# Patient Record
Sex: Female | Born: 1985 | Race: White | Hispanic: No | Marital: Married | Smoking: Former smoker
Health system: Southern US, Community
[De-identification: ages and names within clinical notes are randomized; demographics above are authoritative.]

## PROBLEM LIST (undated history)

## (undated) DIAGNOSIS — N390 Urinary tract infection, site not specified: Secondary | ICD-10-CM

## (undated) DIAGNOSIS — Z9289 Personal history of other medical treatment: Secondary | ICD-10-CM

## (undated) DIAGNOSIS — Z803 Family history of malignant neoplasm of breast: Secondary | ICD-10-CM

## (undated) DIAGNOSIS — A630 Anogenital (venereal) warts: Secondary | ICD-10-CM

## (undated) HISTORY — PX: WISDOM TOOTH EXTRACTION: SHX21

## (undated) HISTORY — DX: Anogenital (venereal) warts: A63.0

## (undated) HISTORY — DX: Urinary tract infection, site not specified: N39.0

## (undated) HISTORY — DX: Personal history of other medical treatment: Z92.89

## (undated) HISTORY — DX: Family history of malignant neoplasm of breast: Z80.3

---
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
## 2005-02-11 ENCOUNTER — Ambulatory Visit: Payer: Self-pay | Admitting: Family Medicine

## 2005-02-25 ENCOUNTER — Ambulatory Visit: Payer: Self-pay | Admitting: Family Medicine

## 2011-04-01 ENCOUNTER — Ambulatory Visit: Payer: Self-pay | Admitting: Internal Medicine

## 2011-04-24 ENCOUNTER — Ambulatory Visit: Payer: Self-pay | Admitting: Family Medicine

## 2011-12-23 ENCOUNTER — Ambulatory Visit: Payer: Self-pay

## 2011-12-23 LAB — RAPID STREP-A WITH REFLX: Micro Text Report: NEGATIVE

## 2011-12-25 LAB — BETA STREP CULTURE(ARMC)

## 2012-04-26 ENCOUNTER — Inpatient Hospital Stay: Payer: Self-pay

## 2012-04-26 LAB — CBC WITH DIFFERENTIAL/PLATELET
Basophil #: 0.1 10*3/uL (ref 0.0–0.1)
HCT: 36.8 % (ref 35.0–47.0)
HGB: 12.2 g/dL (ref 12.0–16.0)
Lymphocyte #: 3.3 10*3/uL (ref 1.0–3.6)
Lymphocyte %: 20.6 %
MCH: 29.7 pg (ref 26.0–34.0)
MCHC: 33.2 g/dL (ref 32.0–36.0)
Monocyte %: 5.7 %
Neutrophil #: 11.6 10*3/uL — ABNORMAL HIGH (ref 1.4–6.5)
Neutrophil %: 72.3 %
Platelet: 266 10*3/uL (ref 150–440)
RDW: 13.7 % (ref 11.5–14.5)
WBC: 16 10*3/uL — ABNORMAL HIGH (ref 3.6–11.0)

## 2013-10-07 ENCOUNTER — Ambulatory Visit: Payer: Self-pay | Admitting: Physician Assistant

## 2013-10-07 LAB — RAPID STREP-A WITH REFLX: Micro Text Report: NEGATIVE

## 2014-02-22 ENCOUNTER — Ambulatory Visit: Payer: Self-pay | Admitting: Family Medicine

## 2014-08-01 DIAGNOSIS — Z9289 Personal history of other medical treatment: Secondary | ICD-10-CM

## 2014-08-01 HISTORY — DX: Personal history of other medical treatment: Z92.89

## 2014-09-28 ENCOUNTER — Ambulatory Visit: Payer: Self-pay | Admitting: Physician Assistant

## 2015-03-07 NOTE — H&P (Signed)
L&D Evaluation:  History:   HPI 29 year old G3 p0020 with EDC=05/08/2012 based on an LMP=08/02/2011 and confirmed with an 8wk 5 day ultrasound presents at 7238 2/7 weeks with c/o LOF since 0415 this AM. Has had no bleeding and a few contractions. Baby active. [facility] remarkable for early onset, normal anatomy scan, elevated one hr GTT with a normal 3 hr GTT, smoking, and a positive GBS. TWG=20#. LABS: A POS, VI, RI, POS GBS. Had last TDAP current on June 2012    Presents with leaking fluid    Patient's Medical History No Chronic Illness    Patient's Surgical History wisdom teeth extraction    Medications Pre Natal Vitamins    Allergies PCN, causes rash    Social History tobacco    Family History Non-Contributory   ROS:   ROS se HPI   Exam:   General no apparent distress    Mental Status clear    Chest clear    Heart normal sinus rhythm, no murmur/gallop/rubs    Abdomen gravid, non-tender    Estimated Fetal Weight Average for gestational age, 7#to7 1/2#    Fetal Position cephalic    Edema no edema    Reflexes 1+    Pelvic no external lesions, SSE+positive pooling and fern. CX:1/50%/-1 to -2    Mebranes Ruptured    Description clear    Ucx occ    Skin dry   Impression:   Impression IUP at 38 2/7 weeks with PROM. +GBS   Plan:   Plan EFM/NST, monitor contractions and for cervical change, Start Kefzol for GBS prophyllaxis. Pitocin induction./augmentation after  discussion. of purpose and risks. Stadol for pain. Epidural when appropriate.   Electronic Signatures: Trinna BalloonGutierrez, Nupur Hohman L (CNM)  (Signed 30-Jun-13 19:20)  Authored: L&D Evaluation   Last Updated: 30-Jun-13 19:20 by Trinna BalloonGutierrez, Latravion Graves L (CNM)

## 2015-04-16 ENCOUNTER — Emergency Department: Payer: BLUE CROSS/BLUE SHIELD

## 2015-04-16 ENCOUNTER — Emergency Department
Admission: EM | Admit: 2015-04-16 | Discharge: 2015-04-16 | Disposition: A | Discharge status: Home / Self Care | Payer: BLUE CROSS/BLUE SHIELD | Attending: Emergency Medicine | Admitting: Emergency Medicine

## 2015-04-16 ENCOUNTER — Encounter: Payer: Self-pay | Admitting: Emergency Medicine

## 2015-04-16 DIAGNOSIS — Y998 Other external cause status: Secondary | ICD-10-CM | POA: Diagnosis not present

## 2015-04-16 DIAGNOSIS — Z720 Tobacco use: Secondary | ICD-10-CM | POA: Insufficient documentation

## 2015-04-16 DIAGNOSIS — Y9389 Activity, other specified: Secondary | ICD-10-CM | POA: Insufficient documentation

## 2015-04-16 DIAGNOSIS — S60221A Contusion of right hand, initial encounter: Secondary | ICD-10-CM | POA: Diagnosis not present

## 2015-04-16 DIAGNOSIS — Y92410 Unspecified street and highway as the place of occurrence of the external cause: Secondary | ICD-10-CM | POA: Insufficient documentation

## 2015-04-16 DIAGNOSIS — S0003XA Contusion of scalp, initial encounter: Secondary | ICD-10-CM | POA: Insufficient documentation

## 2015-04-16 DIAGNOSIS — S0992XA Unspecified injury of nose, initial encounter: Secondary | ICD-10-CM | POA: Diagnosis present

## 2015-04-16 DIAGNOSIS — S0033XA Contusion of nose, initial encounter: Secondary | ICD-10-CM | POA: Diagnosis not present

## 2015-04-16 MED ORDER — IBUPROFEN 800 MG PO TABS
ORAL_TABLET | ORAL | Status: AC
  Administered 2015-04-16: 800 mg via ORAL
  Filled 2015-04-16: qty 1

## 2015-04-16 MED ORDER — OXYCODONE-ACETAMINOPHEN 5-325 MG PO TABS
2.0000 | ORAL_TABLET | Freq: Once | ORAL | Status: AC
  Administered 2015-04-16: 2 via ORAL

## 2015-04-16 MED ORDER — OXYCODONE-ACETAMINOPHEN 5-325 MG PO TABS
ORAL_TABLET | ORAL | Status: AC
  Administered 2015-04-16: 2 via ORAL
  Filled 2015-04-16: qty 2

## 2015-04-16 MED ORDER — IBUPROFEN 800 MG PO TABS
800.0000 mg | ORAL_TABLET | Freq: Once | ORAL | Status: AC
  Administered 2015-04-16: 800 mg via ORAL
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
## 2017-01-22 ENCOUNTER — Telehealth: Payer: Self-pay | Admitting: Obstetrics and Gynecology

## 2017-01-22 ENCOUNTER — Other Ambulatory Visit: Payer: Self-pay | Admitting: Obstetrics and Gynecology

## 2017-01-22 DIAGNOSIS — Z3041 Encounter for surveillance of contraceptive pills: Secondary | ICD-10-CM

## 2017-01-22 MED ORDER — LEVONORGEST-ETH ESTRAD 91-DAY 0.15-0.03 MG PO TABS: 1.0000 | ORAL_TABLET | Freq: Every day | ORAL | 4 refills | Status: DC

## 2017-01-22 NOTE — Telephone Encounter (Signed)
Due to over booking I called and reschedule pt to 03/31/17 per Jessica MusterAlicia. Pt is Asking for refill to get to her next schedule appt 03/31/17 for her Birthcontrol. [facility] [place]. Cb# [phone]

## 2017-01-22 NOTE — Telephone Encounter (Signed)
Please advise 

## 2017-01-22 NOTE — Telephone Encounter (Signed)
Done

## 2017-02-03 ENCOUNTER — Ambulatory Visit: Payer: Self-pay | Admitting: Obstetrics and Gynecology

## 2017-03-20 ENCOUNTER — Other Ambulatory Visit: Payer: Self-pay | Admitting: Obstetrics and Gynecology

## 2017-03-20 DIAGNOSIS — Z3041 Encounter for surveillance of contraceptive pills: Secondary | ICD-10-CM

## 2017-03-31 ENCOUNTER — Encounter: Payer: Self-pay | Admitting: Obstetrics and Gynecology

## 2017-03-31 ENCOUNTER — Ambulatory Visit: Payer: Self-pay | Admitting: Obstetrics and Gynecology

## 2017-05-20 ENCOUNTER — Ambulatory Visit: Payer: Self-pay | Admitting: Obstetrics and Gynecology

## 2017-08-11 ENCOUNTER — Ambulatory Visit
Admission: EM | Admit: 2017-08-11 | Discharge: 2017-08-11 | Disposition: A | Discharge status: Home / Self Care | Payer: BLUE CROSS/BLUE SHIELD | Attending: Family Medicine | Admitting: Family Medicine

## 2017-08-11 ENCOUNTER — Encounter: Payer: Self-pay | Admitting: Emergency Medicine

## 2017-08-11 ENCOUNTER — Ambulatory Visit (INDEPENDENT_AMBULATORY_CARE_PROVIDER_SITE_OTHER): Payer: BLUE CROSS/BLUE SHIELD

## 2017-08-11 DIAGNOSIS — R05 Cough: Secondary | ICD-10-CM

## 2017-08-11 DIAGNOSIS — J019 Acute sinusitis, unspecified: Secondary | ICD-10-CM | POA: Diagnosis not present

## 2017-08-11 DIAGNOSIS — R0602 Shortness of breath: Secondary | ICD-10-CM

## 2017-08-11 DIAGNOSIS — R6883 Chills (without fever): Secondary | ICD-10-CM

## 2017-08-11 DIAGNOSIS — J209 Acute bronchitis, unspecified: Secondary | ICD-10-CM

## 2017-08-11 LAB — RAPID INFLUENZA A&B ANTIGENS (ARMC ONLY): INFLUENZA A (ARMC): NEGATIVE

## 2017-08-11 LAB — RAPID INFLUENZA A&B ANTIGENS: Influenza B (ARMC): NEGATIVE

## 2017-08-11 MED ORDER — ALBUTEROL SULFATE HFA 108 (90 BASE) MCG/ACT IN AERS: 1.0000 | INHALATION_SPRAY | Freq: Four times a day (QID) | RESPIRATORY_TRACT | 0 refills | Status: DC | PRN

## 2017-08-11 MED ORDER — OSELTAMIVIR PHOSPHATE 75 MG PO CAPS: 75.0000 mg | ORAL_CAPSULE | Freq: Two times a day (BID) | ORAL | 0 refills | Status: DC

## 2017-08-11 MED ORDER — AZITHROMYCIN 250 MG PO TABS: 250.0000 mg | ORAL_TABLET | Freq: Every day | ORAL | 0 refills | Status: DC

## 2017-08-11 NOTE — ED Triage Notes (Signed)
Patient in today c/o generalized body aches, chills, sinus congestion and pain when she coughs (productive). Getting progressively worse since Friday (08/08/17). Patient has tried Tylenol sinus severe and Dayquil without relief.
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
## 2017-08-11 NOTE — Discharge Instructions (Signed)
-  azithromycin: Take 2 tablets by mouth the first day followed by one tablet daily for next 4 days. -tamiflu: one tablet every 12 hours for 7 days -Albuterol: 1-2 puffs every 6 hours as needed for wheezing, SOB, or cough -nasal-sinus rinse as attached to help clear out sinuses -push fluids -rest -can continue to use over the counter medications for symptom relief -return to clinic should symptoms worsen or not improve

## 2017-08-14 ENCOUNTER — Ambulatory Visit (INDEPENDENT_AMBULATORY_CARE_PROVIDER_SITE_OTHER): Payer: BLUE CROSS/BLUE SHIELD | Admitting: Obstetrics and Gynecology

## 2017-08-14 ENCOUNTER — Encounter: Payer: Self-pay | Admitting: Obstetrics and Gynecology

## 2017-08-14 VITALS — BP 148/80 | HR 112 | Ht 66.0 in | Wt 172.0 lb

## 2017-08-14 DIAGNOSIS — Z803 Family history of malignant neoplasm of breast: Secondary | ICD-10-CM

## 2017-08-14 DIAGNOSIS — Z01419 Encounter for gynecological examination (general) (routine) without abnormal findings: Secondary | ICD-10-CM

## 2017-08-14 DIAGNOSIS — Z3041 Encounter for surveillance of contraceptive pills: Secondary | ICD-10-CM | POA: Diagnosis not present

## 2017-08-14 DIAGNOSIS — Z1151 Encounter for screening for human papillomavirus (HPV): Secondary | ICD-10-CM

## 2017-08-14 DIAGNOSIS — Z124 Encounter for screening for malignant neoplasm of cervix: Secondary | ICD-10-CM | POA: Diagnosis not present

## 2017-08-14 DIAGNOSIS — A630 Anogenital (venereal) warts: Secondary | ICD-10-CM

## 2017-08-14 MED ORDER — IMIQUIMOD 5 % EX CREA: TOPICAL_CREAM | CUTANEOUS | 2 refills | Status: DC

## 2017-08-14 MED ORDER — LEVONORGEST-ETH ESTRAD 91-DAY 0.15-0.03 MG PO TABS: 1.0000 | ORAL_TABLET | Freq: Every day | ORAL | 3 refills | Status: DC

## 2017-08-14 NOTE — Progress Notes (Signed)
PCP:  Patient, No Pcp Per   Chief Complaint  Patient presents with  . Gynecologic Exam     HPI:      [name] is a 31 y.o. [id number] who LMP was Patient's last menstrual period was 06/23/2017., presents today for her annual examination.  Her menses are regular every 3 months with OCPs, lasting 3 days.  Dysmenorrhea none. She does not have intermenstrual bleeding.  Sex activity: single partner, contraception - OCP (estrogen/progesterone).  Last Pap: August 01, 2014  Results were: no abnormalities  Hx of STDs: ext HPV, exposure from husband. Has lesions that are bothersome to her.  There is a FH of breast cancer in her MGM, genetic testing not indicated. There is no FH of ovarian cancer. The patient does do self-breast exams.  Tobacco use: The patient denies current or previous tobacco use. Alcohol use: none No drug use.  Exercise: moderately active  She does get adequate calcium and Vitamin D in her diet.   Past Medical History:  Diagnosis Date  . History of Papanicolaou smear of cervix 08/01/2014   NIL  . Urinary tract infection     Past Surgical History:  Procedure Laterality Date  . WISDOM TOOTH EXTRACTION      Family History  Problem Relation Age of Onset  . Hypertension Mother   . Hypertension Father   . Breast cancer Maternal Grandmother 60       reoccured  . Diabetes Mellitus II Maternal Grandfather   . Heart disease Maternal Grandfather     Social History   Social History  . Marital status: Married    Spouse name: N/A  . Number of children: N/A  . Years of education: N/A   Occupational History  . Not on file.   Social History Main Topics  . Smoking status: Former Smoker    Packs/day: 1.00    Years: 10.00    Quit date: 05/12/2017  . Smokeless tobacco: Never Used  . Alcohol use 1.2 oz/week    2 Shots of liquor per week  . Drug use: No  . Sexual activity: Yes    Birth control/ protection: Pill   Other Topics Concern  . Not on  file   Social History Narrative  . No narrative on file    No outpatient prescriptions have been marked as taking for the 08/14/17 encounter (Office Visit) with Cleve Paolillo, Ilona SorrelAlicia B, PA-C.     ROS:  Review of Systems  Constitutional: Negative for fatigue, fever and unexpected weight change.  Respiratory: Negative for cough, shortness of breath and wheezing.   Cardiovascular: Negative for chest pain, palpitations and leg swelling.  Gastrointestinal: Negative for blood in stool, constipation, diarrhea, nausea and vomiting.  Endocrine: Negative for cold intolerance, heat intolerance and polyuria.  Genitourinary: Positive for genital sores. Negative for dyspareunia, dysuria, flank pain, frequency, hematuria, menstrual problem, pelvic pain, urgency, vaginal bleeding, vaginal discharge and vaginal pain.  Musculoskeletal: Negative for back pain, joint swelling and myalgias.  Skin: Negative for rash.  Neurological: Positive for headaches. Negative for dizziness, syncope, light-headedness and numbness.  Hematological: Negative for adenopathy.  Psychiatric/Behavioral: Negative for agitation, confusion, sleep disturbance and suicidal ideas. The patient is not nervous/anxious.      Objective: BP (!) 148/80   Pulse (!) 112   Ht 5\' 6"  (1.676 m)   Wt 172 lb (78 kg)   LMP 06/23/2017   BMI 27.76 kg/m    Physical Exam  Constitutional: She is oriented to  person, place, and time. She appears well-developed and well-nourished.  Genitourinary: Vagina normal and uterus normal.  There is lesion on the right labia. There is no rash or tenderness on the right labia.  There is lesion on the left labia. There is no rash or tenderness on the left labia.    No erythema or tenderness in the vagina. No vaginal discharge found. Right adnexum does not display mass and does not display tenderness. Left adnexum does not display mass and does not display tenderness. Cervix does not exhibit motion tenderness or  polyp. Uterus is not enlarged or tender.  Genitourinary Comments: MULT GENITAL WARTS POST FOURCHETTE/PERINEAL AREA/PERIANAL AREA  Neck: Normal range of motion. No thyromegaly present.  Cardiovascular: Normal rate, regular rhythm and normal heart sounds.   No murmur heard. Pulmonary/Chest: Effort normal and breath sounds normal. Right breast exhibits no mass, no nipple discharge, no skin change and no tenderness. Left breast exhibits no mass, no nipple discharge, no skin change and no tenderness.  Abdominal: Soft. There is no tenderness. There is no guarding.  Musculoskeletal: Normal range of motion.  Neurological: She is alert and oriented to person, place, and time. No cranial nerve deficit.  Psychiatric: She has a normal mood and affect. Her behavior is normal.  Vitals reviewed.   Assessment/Plan: Encounter for annual routine gynecological examination  Cervical cancer screening - Plan: IGP, Aptima HPV  Screening for HPV (human papillomavirus) - Plan: IGP, Aptima HPV  Encounter for surveillance of contraceptive pills - OCP RF. - Plan: levonorgestrel-ethinyl estradiol (QUASENSE) 0.15-0.03 MG tablet, DISCONTINUED: levonorgestrel-ethinyl estradiol (QUASENSE) 0.15-0.03 MG tablet  Genital warts - Tx options discussed. Pt wants to try aldara. Rx eRxd. F/u prn.  - Plan: imiquimod (ALDARA) 5 % cream, DISCONTINUED: imiquimod (ALDARA) 5 % cream  Family history of breast cancer - MyRisk tesitng discussed and handout given to pt. She will consider and f/u prn.  Meds ordered this encounter  Medications  . DISCONTD: imiquimod (ALDARA) 5 % cream    Sig: Apply to lesions 3 times wkly (M,W,F or T,Th,Sat) at night, wash in AM; can use up to 12 wks    Dispense:  12 each    Refill:  2  . DISCONTD: levonorgestrel-ethinyl estradiol (QUASENSE) 0.15-0.03 MG tablet    Sig: Take 1 tablet by mouth daily.    Dispense:  91 tablet    Refill:  3  . levonorgestrel-ethinyl estradiol (QUASENSE) 0.15-0.03 MG  tablet    Sig: Take 1 tablet by mouth daily.    Dispense:  91 tablet    Refill:  3  . imiquimod (ALDARA) 5 % cream    Sig: Apply to lesions 3 times wkly (M,W,F or T,Th,Sat) at night, wash in AM; can use up to 12 wks    Dispense:  12 each    Refill:  2             GYN counsel adequate intake of calcium and vitamin D, diet and exercise     F/U  Return in about 1 year (around 08/14/2018).  Kimberley Dastrup B. Gailen Venne, PA-C 08/14/2017 2:25 PM

## 2017-08-16 LAB — IGP, APTIMA HPV
HPV Aptima: NEGATIVE
PAP SMEAR COMMENT: 0

## 2017-10-27 DIAGNOSIS — L750 Bromhidrosis: Secondary | ICD-10-CM | POA: Diagnosis not present

## 2017-10-27 DIAGNOSIS — L2089 Other atopic dermatitis: Secondary | ICD-10-CM | POA: Diagnosis not present

## 2017-10-27 DIAGNOSIS — L718 Other rosacea: Secondary | ICD-10-CM | POA: Diagnosis not present

## 2017-11-11 DIAGNOSIS — L2089 Other atopic dermatitis: Secondary | ICD-10-CM | POA: Diagnosis not present

## 2017-11-11 DIAGNOSIS — L750 Bromhidrosis: Secondary | ICD-10-CM | POA: Diagnosis not present

## 2017-11-28 DIAGNOSIS — F4322 Adjustment disorder with anxiety: Secondary | ICD-10-CM | POA: Diagnosis not present

## 2017-12-05 DIAGNOSIS — F4322 Adjustment disorder with anxiety: Secondary | ICD-10-CM | POA: Diagnosis not present

## 2018-07-07 ENCOUNTER — Ambulatory Visit
Admission: RE | Admit: 2018-07-07 | Discharge: 2018-07-07 | Disposition: A | Discharge status: Home / Self Care | Payer: BLUE CROSS/BLUE SHIELD | Source: Ambulatory Visit | Attending: Family Medicine | Admitting: Family Medicine

## 2018-07-07 ENCOUNTER — Ambulatory Visit
Admission: RE | Admit: 2018-07-07 | Discharge: 2018-07-07 | Disposition: A | Discharge status: Home / Self Care | Payer: BLUE CROSS/BLUE SHIELD | Source: Ambulatory Visit | Attending: Emergency Medicine | Admitting: Emergency Medicine

## 2018-07-07 ENCOUNTER — Ambulatory Visit
Admission: EM | Admit: 2018-07-07 | Discharge: 2018-07-07 | Disposition: A | Discharge status: Home / Self Care | Payer: BLUE CROSS/BLUE SHIELD | Attending: Family Medicine | Admitting: Family Medicine

## 2018-07-07 ENCOUNTER — Other Ambulatory Visit: Payer: Self-pay

## 2018-07-07 DIAGNOSIS — R51 Headache: Secondary | ICD-10-CM | POA: Diagnosis not present

## 2018-07-07 DIAGNOSIS — R110 Nausea: Secondary | ICD-10-CM

## 2018-07-07 DIAGNOSIS — R42 Dizziness and giddiness: Secondary | ICD-10-CM | POA: Diagnosis not present

## 2018-07-07 DIAGNOSIS — R519 Headache, unspecified: Secondary | ICD-10-CM

## 2018-07-07 MED ORDER — SUMATRIPTAN SUCCINATE 50 MG PO TABS: 50.0000 mg | ORAL_TABLET | Freq: Once | ORAL | 0 refills | Status: DC

## 2018-07-07 NOTE — ED Notes (Signed)
Ct Authorization obtained from [facility] for CPT [phone]. Approved for 07/07/2018-01/04/2018. Authorization # [id number].

## 2018-07-07 NOTE — ED Provider Notes (Signed)
[facility]    CSN: [id number] Arrival date & time: 07/07/18  1527  History   Chief Complaint Chief Complaint  Patient presents with  . Headache   HPI  32 year old female presents with headache.  Patient reports a history of frequent headaches.  She states that she has had a 2-3 times a week.  Headache is typically located in the occipital region/posterior neck.  She typically takes Excedrin Migraine with improvement.  Today she developed severe left-sided headache.  Worst headache of her life.  Patient states that the headache has been located in the left temporal region and behind the left eye.  She took Excedrin and pain away without resolution.  No reports of photophobia.  Patient states that she saw her chiropractor earlier today and initially felt improved.  However, when she walked to her car she immediately felt dizzy and nauseous.  Patient is concerned given the severity of her headache as well as a change in the headache character.  No other associated symptoms.  No other complaints.  PMH, Surgical Hx, Family Hx, Social History reviewed and updated as below.  Past Medical History:  Diagnosis Date  . History of Papanicolaou smear of cervix 08/01/2014   NIL  . Urinary tract infection   Frequent Headache  Past Surgical History:  Procedure Laterality Date  . WISDOM TOOTH EXTRACTION      OB History    Gravida  3   Para  1   Term      Preterm      AB  2   Living  1     SAB  1   TAB      Ectopic      Multiple      Live Births  1           Family History Family History  Problem Relation Age of Onset  . Hypertension Mother   . Hypertension Father   . Breast cancer Maternal Grandmother 60       reoccured  . Diabetes Mellitus II Maternal Grandfather   . Heart disease Maternal Grandfather     Social History Social History   Tobacco Use  . Smoking status: Former Smoker    Packs/day: 1.00    Years: 10.00    Pack years: 10.00   Last attempt to quit: 05/12/2017    Years since quitting: 1.1  . Smokeless tobacco: Never Used  Substance Use Topics  . Alcohol use: Not Currently  . Drug use: No     Allergies   Patient has no known allergies.   Review of Systems Review of Systems  Eyes: Negative.   Gastrointestinal: Positive for nausea.  Neurological: Positive for dizziness.       Headache.   Physical Exam Triage Vital Signs ED Triage Vitals  Enc Vitals Group     BP 07/07/18 1550 136/70     Pulse Rate 07/07/18 1550 85     Resp 07/07/18 1550 18     Temp 07/07/18 1550 98.9 F (37.2 C)     Temp Source 07/07/18 1550 Oral     SpO2 07/07/18 1550 99 %     Weight 07/07/18 1548 170 lb (77.1 kg)     Height 07/07/18 1548 5\' 6"  (1.676 m)     Head Circumference --      Peak Flow --      Pain Score 07/07/18 1548 8     Pain Loc --      Pain  Edu? --      Excl. in GC? --    Updated Vital Signs BP 136/70 (BP Location: Left Arm)   Pulse 85   Temp 98.9 F (37.2 C) (Oral)   Resp 18   Ht 5\' 6"  (1.676 m)   Wt 77.1 kg   LMP 06/09/2018   SpO2 99%   BMI 27.44 kg/m   Visual Acuity Right Eye Distance:   Left Eye Distance:   Bilateral Distance:    Right Eye Near:   Left Eye Near:    Bilateral Near:     Physical Exam  Constitutional: She is oriented to person, place, and time. She appears well-developed. No distress.  HENT:  Head: Normocephalic and atraumatic.  Eyes: Pupils are equal, round, and reactive to light. Conjunctivae and EOM are normal. Right eye exhibits no discharge. Left eye exhibits no discharge.  Cardiovascular: Normal rate and regular rhythm.  Pulmonary/Chest: Effort normal and breath sounds normal. She has no wheezes. She has no rales.  Neurological: She is alert and oriented to person, place, and time. No cranial nerve deficit.  Normal muscle strength.  No apparent focal neurological deficits.  Psychiatric: She has a normal mood and affect. Her behavior is normal.  Nursing note and vitals  reviewed.  UC Treatments / Results  Labs (all labs ordered are listed, but only abnormal results are displayed) Labs Reviewed - No data to display  EKG None  Radiology Ct Head Wo Contrast  Result Date: 07/07/2018 CLINICAL DATA:  Severe headache, nausea, and dizziness beginning today. EXAM: CT HEAD WITHOUT CONTRAST TECHNIQUE: Contiguous axial images were obtained from the base of the skull through the vertex without intravenous contrast. COMPARISON:  None. FINDINGS: Brain: No evidence of acute infarction, hemorrhage, hydrocephalus, extra-axial collection, or mass lesion/mass effect. Vascular:  No hyperdense vessel or other acute findings. Skull: No evidence of fracture or other significant bone abnormality. Sinuses/Orbits:  No acute findings. Other: None. IMPRESSION: Negative noncontrast head CT. Electronically Signed   By: Myles Rosenthal M.D.   On: 07/07/2018 18:43    Procedures Procedures (including critical care time)  Medications Ordered in UC Medications - No data to display  Initial Impression / Assessment and Plan / UC Course  I have reviewed the triage vital signs and the nursing notes.  Pertinent labs & imaging results that were available during my care of the patient were reviewed by me and considered in my medical decision making (see chart for details).    32 year old female presents with severe headache.  Worst headache of her life.  Change from prior headaches.  We discussed CT imaging and patient agreed to proceed.  While I was in a room with another patient, the patient elected to leave abruptly as she did not want to wait.  Patient subsequently came back and requested to have her CT performed at that time.  CT was performed and was negative.  Treating patient for atypical migraine with Imitrex.  Final Clinical Impressions(s) / UC Diagnoses   Final diagnoses:  Bad headache   Discharge Instructions   None    ED Prescriptions    Medication Sig Dispense Auth. Provider    SUMAtriptan (IMITREX) 50 MG tablet Take 1 tablet (50 mg total) by mouth once for 1 dose. May repeat in 2 hours if headache persists or recurs. 10 tablet Tommie Sams, DO     Controlled Substance Prescriptions [place] Controlled Substance Registry consulted? Not Applicable   Tommie Sams, DO 07/07/18 2042

## 2018-07-07 NOTE — ED Notes (Signed)
Patient decided that she did not want to wait to have a head CT, states that her son has football Games developer. Patient decided to leave before Head CT performed.

## 2018-07-07 NOTE — ED Triage Notes (Signed)
Patient complains of headache that started this morning. Patient states that she has taken excedrin and pain-away. Patient states that she went to see her chriopractor and received an adjustment today and felt better immediately when she got to the car the headache returned and now has significant pain behind left eye ball. Patient describes this as worse headache of her life.

## 2018-08-05 ENCOUNTER — Other Ambulatory Visit: Payer: Self-pay | Admitting: Obstetrics and Gynecology

## 2018-08-05 DIAGNOSIS — Z3041 Encounter for surveillance of contraceptive pills: Secondary | ICD-10-CM

## 2018-09-17 ENCOUNTER — Encounter: Payer: Self-pay | Admitting: Obstetrics and Gynecology

## 2018-09-17 ENCOUNTER — Ambulatory Visit (INDEPENDENT_AMBULATORY_CARE_PROVIDER_SITE_OTHER): Payer: BLUE CROSS/BLUE SHIELD | Admitting: Obstetrics and Gynecology

## 2018-09-17 VITALS — BP 146/90 | HR 115 | Ht 66.0 in | Wt 175.0 lb

## 2018-09-17 DIAGNOSIS — Z01419 Encounter for gynecological examination (general) (routine) without abnormal findings: Secondary | ICD-10-CM

## 2018-09-17 DIAGNOSIS — Z3041 Encounter for surveillance of contraceptive pills: Secondary | ICD-10-CM

## 2018-09-17 DIAGNOSIS — A630 Anogenital (venereal) warts: Secondary | ICD-10-CM

## 2018-09-17 DIAGNOSIS — Z803 Family history of malignant neoplasm of breast: Secondary | ICD-10-CM

## 2018-09-17 MED ORDER — LEVONORGEST-ETH ESTRAD 91-DAY 0.15-0.03 MG PO TABS: 1.0000 | ORAL_TABLET | Freq: Every day | ORAL | 3 refills | Status: DC

## 2018-09-17 NOTE — Progress Notes (Signed)
PCP:  Patient, No Pcp Per   Chief Complaint  Patient presents with  . Gynecologic Exam     HPI:      [name] is a 32 y.o. [id number] who LMP was Patient's last menstrual period was 08/24/2018 (approximate)., presents today for her annual examination.  Her menses are regular every 3 months with OCPs, lasting 3 days.  Dysmenorrhea none. She does not have intermenstrual bleeding.  Sex activity: single partner, contraception - OCP (estrogen/progesterone).  Last Pap: 08/14/17  Results were: no abnormalities /neg HPV DNA Hx of STDs: ext HPV, exposure from husband. Has lesions that are bothersome to her. Tried aldara last yr without relief.   There is a FH of breast cancer in her MGM twice, genetic testing not done. Pt declined MyRisk testing last yr. There is no FH of ovarian cancer. The patient does do self-breast exams.  Tobacco use: The patient denies current or previous tobacco use. Alcohol use: none No drug use.  Exercise: moderately active  She does get adequate calcium and Vitamin D in her diet.   Past Medical History:  Diagnosis Date  . History of Papanicolaou smear of cervix 08/01/2014   NIL  . Urinary tract infection     Past Surgical History:  Procedure Laterality Date  . WISDOM TOOTH EXTRACTION      Family History  Problem Relation Age of Onset  . Hypertension Mother   . Hypertension Father   . Breast cancer Maternal Grandmother 60       reoccured  . Diabetes Mellitus II Maternal Grandfather   . Heart disease Maternal Grandfather     Social History   Socioeconomic History  . Marital status: Married    Spouse name: Not on file  . Number of children: Not on file  . Years of education: Not on file  . Highest education level: Not on file  Occupational History  . Not on file  Social Needs  . Financial resource strain: Not on file  . Food insecurity:    Worry: Not on file    Inability: Not on file  . Transportation needs:    Medical: Not  on file    Non-medical: Not on file  Tobacco Use  . Smoking status: Former Smoker    Packs/day: 1.00    Years: 10.00    Pack years: 10.00    Last attempt to quit: 05/12/2017    Years since quitting: 1.3  . Smokeless tobacco: Never Used  Substance and Sexual Activity  . Alcohol use: Not Currently  . Drug use: No  . Sexual activity: Yes    Birth control/protection: Pill  Lifestyle  . Physical activity:    Days per week: Not on file    Minutes per session: Not on file  . Stress: Not on file  Relationships  . Social connections:    Talks on phone: Not on file    Gets together: Not on file    Attends religious service: Not on file    Active member of club or organization: Not on file    Attends meetings of clubs or organizations: Not on file    Relationship status: Not on file  . Intimate partner violence:    Fear of current or ex partner: Not on file    Emotionally abused: Not on file    Physically abused: Not on file    Forced sexual activity: Not on file  Other Topics Concern  . Not on file  Social History Narrative  . Not on file    Current Meds  Medication Sig  . levonorgestrel-ethinyl estradiol (QUASENSE) 0.15-0.03 MG tablet Take 1 tablet by mouth daily.  . [DISCONTINUED] levonorgestrel-ethinyl estradiol (QUASENSE) 0.15-0.03 MG tablet Take 1 tablet by mouth daily.     ROS:  Review of Systems  Constitutional: Negative for fatigue, fever and unexpected weight change.  Respiratory: Negative for cough, shortness of breath and wheezing.   Cardiovascular: Negative for chest pain, palpitations and leg swelling.  Gastrointestinal: Negative for blood in stool, constipation, diarrhea, nausea and vomiting.  Endocrine: Negative for cold intolerance, heat intolerance and polyuria.  Genitourinary: Positive for genital sores. Negative for dyspareunia, dysuria, flank pain, frequency, hematuria, menstrual problem, pelvic pain, urgency, vaginal bleeding, vaginal discharge and  vaginal pain.  Musculoskeletal: Negative for back pain, joint swelling and myalgias.  Skin: Negative for rash.  Neurological: Negative for dizziness, syncope, light-headedness, numbness and headaches.  Hematological: Negative for adenopathy.  Psychiatric/Behavioral: Negative for agitation, confusion, sleep disturbance and suicidal ideas. The patient is not nervous/anxious.      Objective: BP (!) 146/90   Pulse (!) 115   Ht 5\' 6"  (1.676 m)   Wt 175 lb (79.4 kg)   LMP 08/24/2018 (Approximate)   BMI 28.25 kg/m    Physical Exam  Constitutional: She is oriented to person, place, and time. She appears well-developed and well-nourished.  Genitourinary: Vagina normal and uterus normal.  There is lesion on the right labia. There is no rash or tenderness on the right labia.  There is lesion on the left labia. There is no rash or tenderness on the left labia. No erythema or tenderness in the vagina. No vaginal discharge found. Right adnexum does not display mass and does not display tenderness. Left adnexum does not display mass and does not display tenderness. Cervix does not exhibit motion tenderness or polyp. Uterus is not enlarged or tender.  Genitourinary Comments: MULT GENITAL WARTS POST FOURCHETTE/PERINEAL AREA/PERIANAL AREA  Neck: Normal range of motion. No thyromegaly present.  Cardiovascular: Normal rate, regular rhythm and normal heart sounds.  No murmur heard. Pulmonary/Chest: Effort normal and breath sounds normal. Right breast exhibits no mass, no nipple discharge, no skin change and no tenderness. Left breast exhibits no mass, no nipple discharge, no skin change and no tenderness.  Abdominal: Soft. There is no tenderness. There is no guarding.  Musculoskeletal: Normal range of motion.  Neurological: She is alert and oriented to person, place, and time. No cranial nerve deficit.  Psychiatric: She has a normal mood and affect. Her behavior is normal.  Vitals  reviewed.   Assessment/Plan: Encounter for annual routine gynecological examination  Encounter for surveillance of contraceptive pills - OCP RF - Plan: levonorgestrel-ethinyl estradiol (QUASENSE) 0.15-0.03 MG tablet  Family history of breast cancer - MyRisk testing discussed and handout given. Pt to f/u if desires.   Genital warts - TCA tx applied. Wash in 4-6 hrs. F/u if sx persist  Meds ordered this encounter  Medications  . levonorgestrel-ethinyl estradiol (QUASENSE) 0.15-0.03 MG tablet    Sig: Take 1 tablet by mouth daily.    Dispense:  91 tablet    Refill:  3    Order Specific Question:   Supervising Provider    Answer:   Nadara MustardHARRIS, ROBERT P [[id number]]             GYN counsel adequate intake of calcium and vitamin D, diet and exercise     F/U  Return in about 1 year (  around 09/18/2019).  Alicia B. Copland, PA-C 09/17/2018 11:51 AM

## 2018-09-17 NOTE — Patient Instructions (Signed)
I value your feedback and entrusting us with your care. If you get a [facility] patient survey, I would appreciate you taking the time to let us know about your experience today. Thank you! 

## 2018-11-28 IMAGING — CR DG CHEST 2V
2 series · 2 of 2 positions shown · non-contrast
Comparison: No priors.

CLINICAL DATA: 31-year-old female with history productive cough and
shortness of breath with exertion for the past 4 days.

EXAM:
CHEST  2 VIEW

[chest pa]
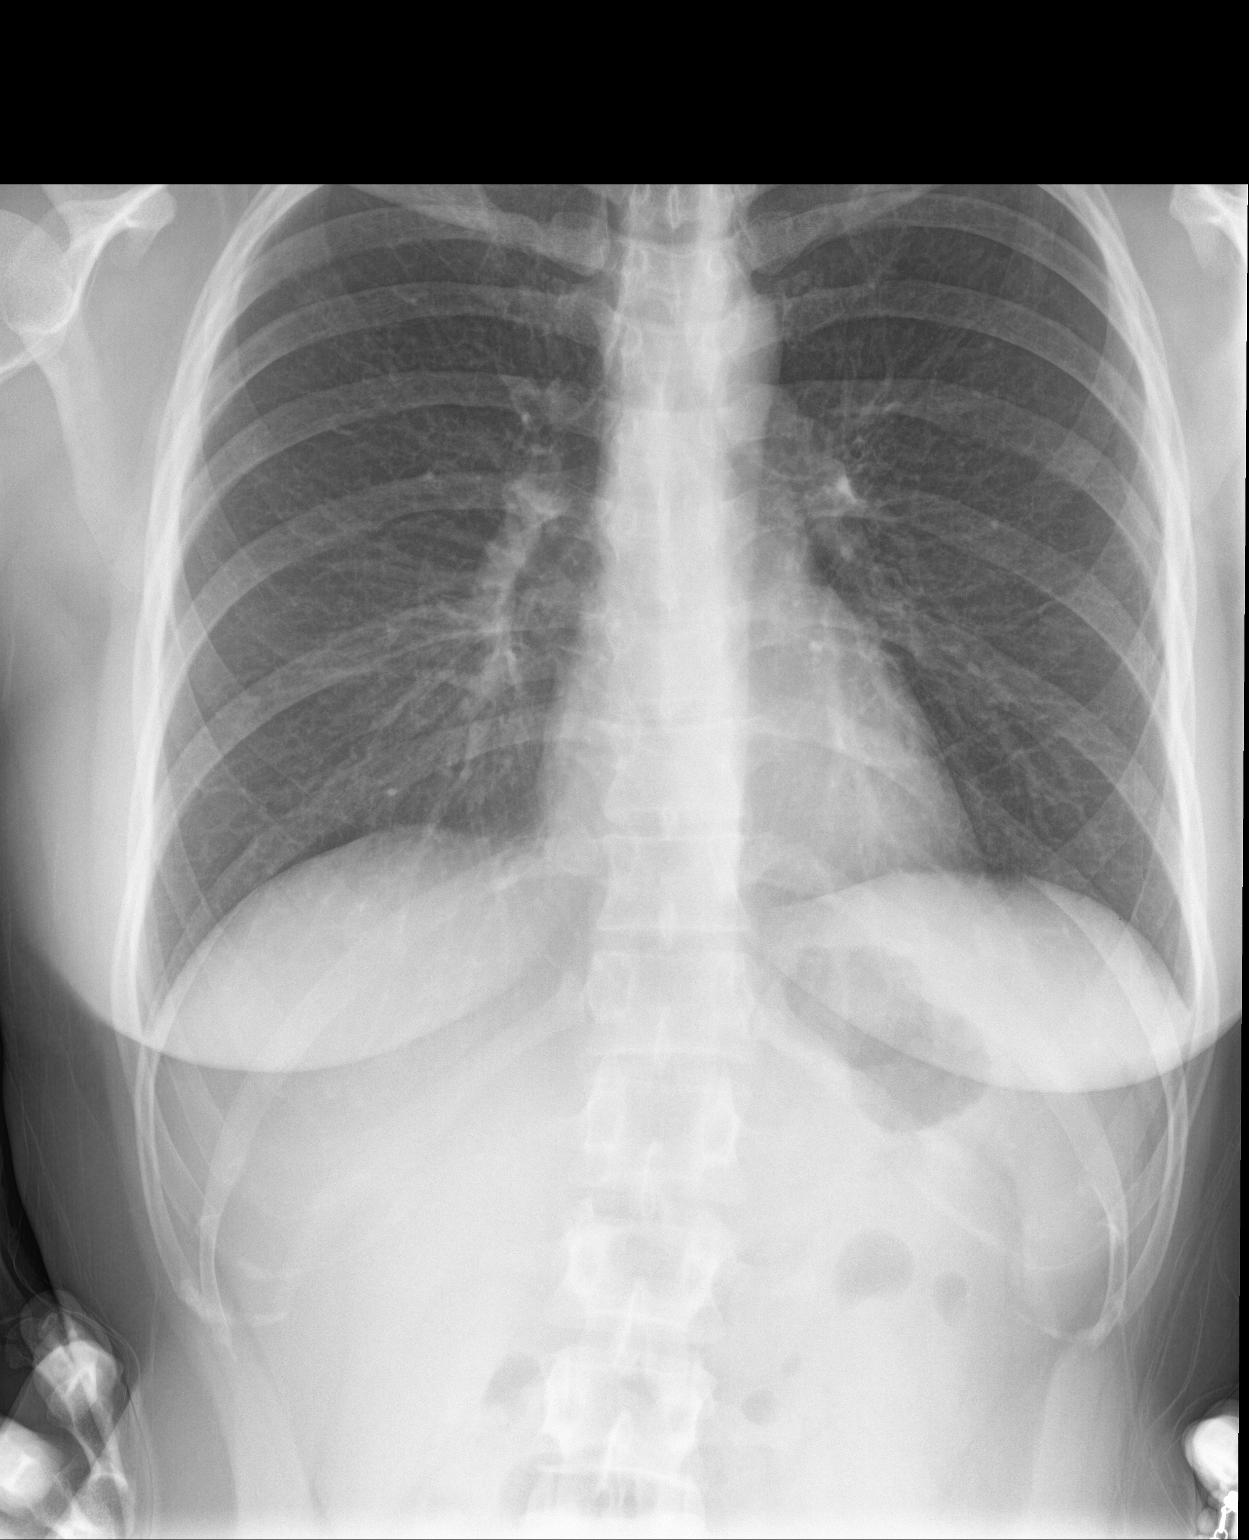

[chest lat]
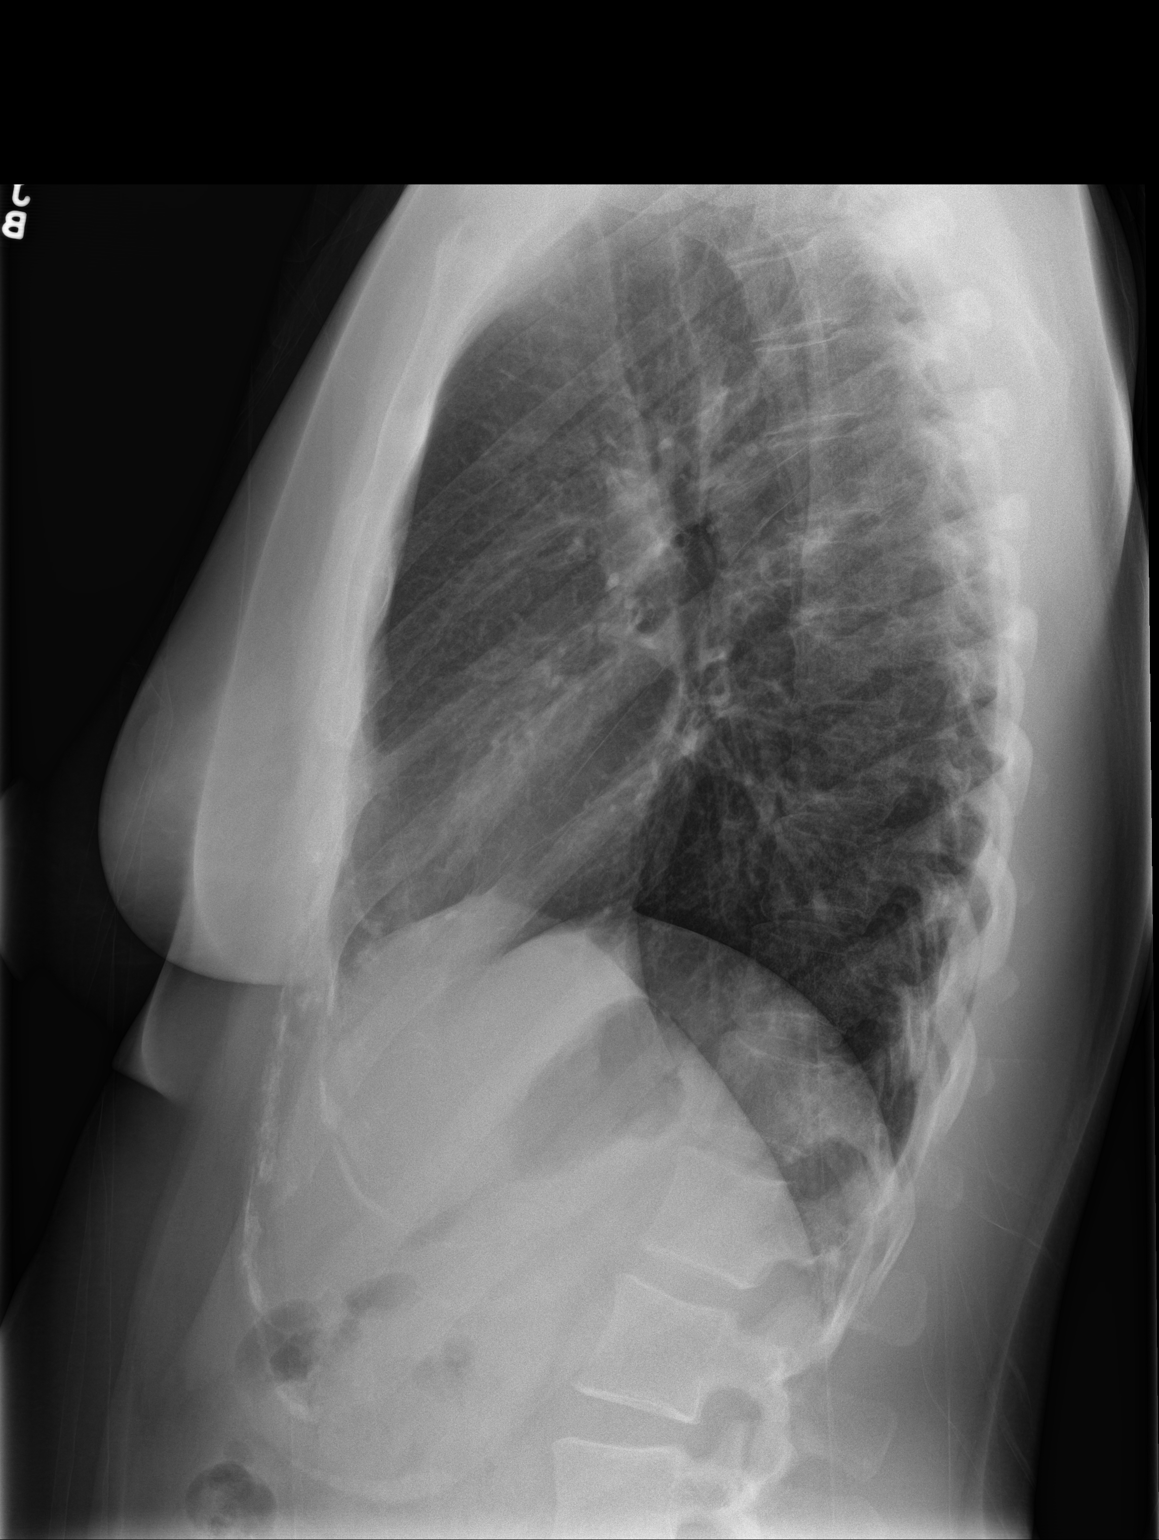

[2 of 2 positions shown; findings below may reference images not displayed]

FINDINGS: Lung volumes are normal. No consolidative airspace disease. No
pleural effusions. No pneumothorax. No pulmonary nodule or mass
noted. Pulmonary vasculature and the cardiomediastinal silhouette
are within normal limits.
IMPRESSION: No radiographic evidence of acute cardiopulmonary disease.

## 2019-09-10 ENCOUNTER — Other Ambulatory Visit: Payer: Self-pay | Admitting: Obstetrics and Gynecology

## 2019-09-10 DIAGNOSIS — Z3041 Encounter for surveillance of contraceptive pills: Secondary | ICD-10-CM

## 2019-09-13 ENCOUNTER — Telehealth: Payer: Self-pay

## 2019-09-13 DIAGNOSIS — Z3041 Encounter for surveillance of contraceptive pills: Secondary | ICD-10-CM

## 2019-09-13 MED ORDER — LEVONORGEST-ETH ESTRAD 91-DAY 0.15-0.03 MG PO TABS: 1.0000 | ORAL_TABLET | Freq: Every day | ORAL | 3 refills | Status: DC

## 2019-09-13 NOTE — Telephone Encounter (Signed)
Patient is scheduled for AE 10/13/2019 and needs 1 refill to get her to her apt. VQ#[phone]

## 2019-09-13 NOTE — Telephone Encounter (Signed)
Patient aware refill sent in.  

## 2019-10-13 ENCOUNTER — Ambulatory Visit (INDEPENDENT_AMBULATORY_CARE_PROVIDER_SITE_OTHER): Payer: BC Managed Care – PPO | Admitting: Obstetrics and Gynecology

## 2019-10-13 ENCOUNTER — Other Ambulatory Visit: Payer: Self-pay

## 2019-10-13 ENCOUNTER — Encounter: Payer: Self-pay | Admitting: Obstetrics and Gynecology

## 2019-10-13 VITALS — BP 130/90 | Ht 67.0 in | Wt 180.0 lb

## 2019-10-13 DIAGNOSIS — A630 Anogenital (venereal) warts: Secondary | ICD-10-CM

## 2019-10-13 DIAGNOSIS — Z803 Family history of malignant neoplasm of breast: Secondary | ICD-10-CM | POA: Insufficient documentation

## 2019-10-13 DIAGNOSIS — Z3041 Encounter for surveillance of contraceptive pills: Secondary | ICD-10-CM

## 2019-10-13 DIAGNOSIS — Z01419 Encounter for gynecological examination (general) (routine) without abnormal findings: Secondary | ICD-10-CM

## 2019-10-13 MED ORDER — LEVONORGEST-ETH ESTRAD 91-DAY 0.15-0.03 MG PO TABS: 1.0000 | ORAL_TABLET | Freq: Every day | ORAL | 3 refills | Status: DC

## 2019-10-13 NOTE — Progress Notes (Signed)
PCP:  Patient, No Pcp Per   Chief Complaint  Patient presents with  . Gynecologic Exam     HPI:      Jessica Cohen is a 33 y.o. [id number] who LMP was Patient's last menstrual period was 09/22/2019 (approximate)., presents today for her annual examination.  Her menses are regular every 3 months with OCPs, lasting 3 days.  Dysmenorrhea none. She does not have intermenstrual bleeding.  Sex activity: single partner, contraception - OCP (estrogen/progesterone).  Last Pap: 08/14/17  Results were: no abnormalities /neg HPV DNA Hx of STDs: ext HPV, exposure from husband. Has lesions that are bothersome to her. Tried aldara in past without relief. Treated last yr with TCA with relief. Got new lesions about 4 months later, but none since. Wants tx again today.  There is a FH of breast cancer in her MGM twice, genetic testing not done. Pt declined MyRisk testing several times in past. May be interested this yr. There is no FH of ovarian cancer. The patient does do self-breast exams.  Tobacco use: The patient denies current or previous tobacco use. Alcohol use: none No drug use.  Exercise: min active  She does get adequate calcium but not Vitamin D in her diet.   Past Medical History:  Diagnosis Date  . Family history of breast cancer   . Genital warts   . History of Papanicolaou smear of cervix 08/01/2014   NIL  . Urinary tract infection     Past Surgical History:  Procedure Laterality Date  . WISDOM TOOTH EXTRACTION      Family History  Problem Relation Age of Onset  . Hypertension Mother   . Hypertension Father   . Breast cancer Maternal Grandmother 60       reoccured  . Diabetes Mellitus II Maternal Grandfather   . Heart disease Maternal Grandfather     Social History   Socioeconomic History  . Marital status: Married    Spouse name: Not on file  . Number of children: Not on file  . Years of education: Not on file  . Highest education level: Not on file   Occupational History  . Not on file  Tobacco Use  . Smoking status: Former Smoker    Packs/day: 1.00    Years: 10.00    Pack years: 10.00    Quit date: 05/12/2017    Years since quitting: 2.4  . Smokeless tobacco: Never Used  Substance and Sexual Activity  . Alcohol use: Not Currently  . Drug use: No  . Sexual activity: Yes    Birth control/protection: Pill  Other Topics Concern  . Not on file  Social History Narrative  . Not on file   Social Determinants of Health   Financial Resource Strain:   . Difficulty of Paying Living Expenses: Not on file  Food Insecurity:   . Worried About Charity fundraiser in the Last Year: Not on file  . Ran Out of Food in the Last Year: Not on file  Transportation Needs:   . Lack of Transportation (Medical): Not on file  . Lack of Transportation (Non-Medical): Not on file  Physical Activity:   . Days of Exercise per Week: Not on file  . Minutes of Exercise per Session: Not on file  Stress:   . Feeling of Stress : Not on file  Social Connections:   . Frequency of Communication with Friends and Family: Not on file  . Frequency of Social Gatherings with  Friends and Family: Not on file  . Attends Religious Services: Not on file  . Active Member of Clubs or Organizations: Not on file  . Attends Banker Meetings: Not on file  . Marital Status: Not on file  Intimate Partner Violence:   . Fear of Current or Ex-Partner: Not on file  . Emotionally Abused: Not on file  . Physically Abused: Not on file  . Sexually Abused: Not on file    Current Meds  Medication Sig  . levonorgestrel-ethinyl estradiol (QUASENSE) 0.15-0.03 MG tablet Take 1 tablet by mouth daily.  . [DISCONTINUED] levonorgestrel-ethinyl estradiol (QUASENSE) 0.15-0.03 MG tablet Take 1 tablet by mouth daily.     ROS:  Review of Systems  Constitutional: Negative for fatigue, fever and unexpected weight change.  Respiratory: Negative for cough, shortness of breath  and wheezing.   Cardiovascular: Negative for chest pain, palpitations and leg swelling.  Gastrointestinal: Negative for blood in stool, constipation, diarrhea, nausea and vomiting.  Endocrine: Negative for cold intolerance, heat intolerance and polyuria.  Genitourinary: Positive for genital sores. Negative for dyspareunia, dysuria, flank pain, frequency, hematuria, menstrual problem, pelvic pain, urgency, vaginal bleeding, vaginal discharge and vaginal pain.  Musculoskeletal: Negative for back pain, joint swelling and myalgias.  Skin: Negative for rash.  Neurological: Negative for dizziness, syncope, light-headedness, numbness and headaches.  Hematological: Negative for adenopathy.  Psychiatric/Behavioral: Negative for agitation, confusion, sleep disturbance and suicidal ideas. The patient is not nervous/anxious.      Objective: BP 130/90   Ht 5\' 7"  (1.702 m)   Wt 180 lb (81.6 kg)   LMP 09/22/2019 (Approximate)   BMI 28.19 kg/m    Physical Exam Constitutional:      Appearance: She is well-developed.  Genitourinary:     Vagina, uterus, right adnexa and left adnexa normal.     Vulval condylomata present.     No vulval lesion or tenderness noted.        No vaginal discharge, erythema or tenderness.     No cervical motion tenderness or polyp.     Uterus is not enlarged or tender.     No right or left adnexal mass present.     Right adnexa not tender.     Left adnexa not tender.     Genitourinary Comments: MULT GENITAL WARTS POST FOURCHETTE/PERINEAL AREA/PERIANAL AREA  Neck:     Thyroid: No thyromegaly.  Cardiovascular:     Rate and Rhythm: Normal rate and regular rhythm.     Heart sounds: Normal heart sounds. No murmur.  Pulmonary:     Effort: Pulmonary effort is normal.     Breath sounds: Normal breath sounds.  Chest:     Breasts:        Right: No mass, nipple discharge, skin change or tenderness.        Left: No mass, nipple discharge, skin change or tenderness.   Abdominal:     Palpations: Abdomen is soft.     Tenderness: There is no abdominal tenderness. There is no guarding.  Musculoskeletal:        General: Normal range of motion.     Cervical back: Normal range of motion.  Neurological:     General: No focal deficit present.     Mental Status: She is alert and oriented to person, place, and time.     Cranial Nerves: No cranial nerve deficit.  Skin:    General: Skin is warm and dry.  Psychiatric:  Mood and Affect: Mood normal.        Behavior: Behavior normal.        Thought Content: Thought content normal.        Judgment: Judgment normal.  Vitals reviewed.   WARTS TREATED WITH TCA. PT TOLERATED WELL.   Assessment/Plan: Encounter for annual routine gynecological examination  Encounter for surveillance of contraceptive pills - Plan: levonorgestrel-ethinyl estradiol (QUASENSE) 0.15-0.03 MG tablet; OCP RF  Family history of breast cancer--Myrisk testing discussed and pt considering. Will call for RN appt if desires. Handout given.  Genital warts--treated with TCA. Wash after 4-6 hrs. RTO if sx persist.    Meds ordered this encounter  Medications  . levonorgestrel-ethinyl estradiol (QUASENSE) 0.15-0.03 MG tablet    Sig: Take 1 tablet by mouth daily.    Dispense:  91 tablet    Refill:  3    Order Specific Question:   Supervising Provider    Answer:   Nadara MustardHARRIS, ROBERT P [[id number]]             GYN counsel adequate intake of calcium and vitamin D, diet and exercise     F/U  Return in about 1 year (around 10/12/2020).   B. , PA-C 10/13/2019 3:35 PM

## 2019-10-13 NOTE — Patient Instructions (Signed)
I value your feedback and entrusting us with your care. If you get a [facility] patient survey, I would appreciate you taking the time to let us know about your experience today. Thank you!  As of October 07, 2019, your lab results will be released to your MyChart immediately, before I even have a chance to see them. Please give me time to review them and contact you if there are any abnormalities. Thank you for your patience.  

## 2020-06-22 ENCOUNTER — Ambulatory Visit (INDEPENDENT_AMBULATORY_CARE_PROVIDER_SITE_OTHER): Payer: BC Managed Care – PPO | Admitting: Obstetrics & Gynecology

## 2020-06-22 ENCOUNTER — Other Ambulatory Visit: Payer: Self-pay

## 2020-06-22 ENCOUNTER — Encounter: Payer: Self-pay | Admitting: Obstetrics & Gynecology

## 2020-06-22 VITALS — BP 120/80 | Ht 67.0 in | Wt 170.0 lb

## 2020-06-22 DIAGNOSIS — N938 Other specified abnormal uterine and vaginal bleeding: Secondary | ICD-10-CM

## 2020-06-22 LAB — POCT URINE PREGNANCY: Preg Test, Ur: NEGATIVE

## 2020-06-22 NOTE — Addendum Note (Signed)
Addended by: Cornelius Moras D on: 06/22/2020 09:26 AM   Modules accepted: Orders

## 2020-06-22 NOTE — Progress Notes (Signed)
HPI:      [name] is a 34 y.o. [id number] who LMP was Patient's last menstrual period was 06/21/2020., presents today for a problem visit.  She complains of metrorrhagia (midcycle) that  began about a month ago and its severity is described as moderate.  She has regular periods every 90 days and they are associated with mild menstrual cramping.  This past cycle, she had BTB for 3 days at the 60d mark, then her reg period began 2 days ago and is more w clots than usual for her.  She reports missing a pill a couple of times this past cycle, but not sure when.  She has no other sx's of pregnancy, then or now.  She has used the following for attempts at control: maxi pad.  Previous evaluation: none. Prior Diagnosis: none. Previous Treatment: none.  She is single partner, contraception - OCP (estrogen/progesterone).  Hx of STDs: none. She is premenopausal.  PMHx: She  has a past medical history of Family history of breast cancer, Genital warts, History of Papanicolaou smear of cervix (08/01/2014), and Urinary tract infection. Also,  has a past surgical history that includes Wisdom tooth extraction., family history includes Breast cancer (age of onset: 46) in her maternal grandmother; Diabetes Mellitus II in her maternal grandfather; Heart disease in her maternal grandfather; Hypertension in her father and mother.,  reports that she quit smoking about 3 years ago. She has a 10.00 pack-year smoking history. She has never used smokeless tobacco. She reports previous alcohol use. She reports that she does not use drugs.  She  Current Outpatient Medications:    levonorgestrel-ethinyl estradiol (QUASENSE) 0.15-0.03 MG tablet, Take 1 tablet by mouth daily., Disp: 91 tablet, Rfl: 3  Also, has No Known Allergies.  Review of Systems  Constitutional: Negative for chills, fever and malaise/fatigue.  HENT: Negative for congestion, sinus pain and sore throat.   Eyes: Negative for blurred vision and  pain.  Respiratory: Negative for cough and wheezing.   Cardiovascular: Negative for chest pain and leg swelling.  Gastrointestinal: Negative for abdominal pain, constipation, diarrhea, heartburn, nausea and vomiting.  Genitourinary: Negative for dysuria, frequency, hematuria and urgency.  Musculoskeletal: Negative for back pain, joint pain, myalgias and neck pain.  Skin: Negative for itching and rash.  Neurological: Negative for dizziness, tremors and weakness.  Endo/Heme/Allergies: Does not bruise/bleed easily.  Psychiatric/Behavioral: Negative for depression. The patient is not nervous/anxious and does not have insomnia.     Objective: BP 120/80    Ht 5\' 7"  (1.702 m)    Wt 170 lb (77.1 kg)    LMP 06/21/2020    BMI 26.63 kg/m  Physical Exam Constitutional:      General: She is not in acute distress.    Appearance: She is well-developed.  Genitourinary:     Pelvic exam was performed with patient supine.     Vagina and uterus normal.     No vaginal erythema or bleeding.     No cervical motion tenderness, discharge, polyp or nabothian cyst.     Uterus is mobile.     Uterus is not enlarged.     No uterine mass detected.    Uterus is midaxial.     No right or left adnexal mass present.     Right adnexa not tender.     Left adnexa not tender.     Genitourinary Comments: No cervical lesions or polyp  HENT:     Head: Normocephalic and atraumatic.  Nose: Nose normal.  Abdominal:     General: There is no distension.     Palpations: Abdomen is soft.     Tenderness: There is no abdominal tenderness.  Musculoskeletal:        General: Normal range of motion.  Neurological:     Mental Status: She is alert and oriented to person, place, and time.     Cranial Nerves: No cranial nerve deficit.  Skin:    General: Skin is warm and dry.  Psychiatric:        Attention and Perception: Attention normal.        Mood and Affect: Mood and affect normal.        Speech: Speech normal.         Behavior: Behavior normal.        Thought Content: Thought content normal.        Judgment: Judgment normal.     uCG NEG  ASSESSMENT/PLAN:  Problem List Items Addressed This Visit    Dysfunctional uterine bleeding    -  Primary/New Concern    Likely hormonal etiology No pregnancy Consider [place] for further eval for uterine pathology if sx's persist Restart new pack of extended pills soon (even earlier than the usual Sunday Start) Annul/PAP within 2 mos, also for f/u for this new concern  A total of 20 minutes were spent face-to-face with the patient as well as preparation, review, communication, and documentation during this encounter.    Annamarie Major, MD, [facility], [facility] 06/22/2020  9:13 AM

## 2020-06-22 NOTE — Patient Instructions (Signed)
Thank you for choosing [facility]. As part of our ongoing efforts to improve patient experience, we would appreciate your feedback. Please fill out the short survey that you will receive by mail or MyChart. Your opinion is important to us! -Dr Harrold Fitchett  

## 2020-09-27 DIAGNOSIS — Z1371 Encounter for nonprocreative screening for genetic disease carrier status: Secondary | ICD-10-CM

## 2020-09-27 DIAGNOSIS — Z9189 Other specified personal risk factors, not elsewhere classified: Secondary | ICD-10-CM

## 2020-09-27 HISTORY — DX: Other specified personal risk factors, not elsewhere classified: Z91.89

## 2020-09-27 HISTORY — DX: Encounter for nonprocreative screening for genetic disease carrier status: Z13.71

## 2020-10-16 ENCOUNTER — Encounter: Payer: Self-pay | Admitting: Obstetrics and Gynecology

## 2020-10-16 ENCOUNTER — Other Ambulatory Visit: Payer: Self-pay

## 2020-10-16 ENCOUNTER — Ambulatory Visit (INDEPENDENT_AMBULATORY_CARE_PROVIDER_SITE_OTHER): Payer: BC Managed Care – PPO | Admitting: Obstetrics and Gynecology

## 2020-10-16 VITALS — BP 130/90 | Ht 67.0 in | Wt 176.0 lb

## 2020-10-16 DIAGNOSIS — Z3041 Encounter for surveillance of contraceptive pills: Secondary | ICD-10-CM | POA: Diagnosis not present

## 2020-10-16 DIAGNOSIS — Z803 Family history of malignant neoplasm of breast: Secondary | ICD-10-CM | POA: Diagnosis not present

## 2020-10-16 DIAGNOSIS — Z01419 Encounter for gynecological examination (general) (routine) without abnormal findings: Secondary | ICD-10-CM

## 2020-10-16 MED ORDER — LEVONORGEST-ETH ESTRAD 91-DAY 0.15-0.03 MG PO TABS: 1.0000 | ORAL_TABLET | Freq: Every day | ORAL | 3 refills | Status: DC

## 2020-10-16 NOTE — Patient Instructions (Signed)
I value your feedback and entrusting us with your care. If you get a [facility] patient survey, I would appreciate you taking the time to let us know about your experience today. Thank you!  As of October 07, 2019, your lab results will be released to your MyChart immediately, before I even have a chance to see them. Please give me time to review them and contact you if there are any abnormalities. Thank you for your patience.  

## 2020-10-16 NOTE — Progress Notes (Signed)
PCP:  Patient, No Pcp Per   Chief Complaint  Patient presents with  . Gynecologic Exam    No concerns     HPI:      [name] is a 34 y.o. [id number] who LMP was Patient's last menstrual period was 09/25/2020 (approximate)., presents today for her annual examination.  Her menses are regular every 3 months with OCPs, lasting 3 days.  Dysmenorrhea none. She does not have intermenstrual bleeding now; did have some earlier this yr with late pills.  Sex activity: single partner, contraception - OCP (estrogen/progesterone).  Last Pap: 08/14/17  Results were: no abnormalities /neg HPV DNA Hx of STDs: ext HPV, exposure from husband. Treated with TCA with sx relief. No sx now.  There is a FH of breast cancer in her MGM 3 times now, genetic testing not done. Pt declined MyRisk testing several times in past but now wants it. There is no FH of ovarian cancer. The patient does do self-breast exams.  Tobacco use: The patient denies current or previous tobacco use. Alcohol use: none No drug use.  Exercise: min active  She does get adequate calcium and Vitamin D in her diet.   Past Medical History:  Diagnosis Date  . Family history of breast cancer   . Genital warts   . History of Papanicolaou smear of cervix 08/01/2014   NIL  . Urinary tract infection     Past Surgical History:  Procedure Laterality Date  . WISDOM TOOTH EXTRACTION      Family History  Problem Relation Age of Onset  . Hypertension Mother   . Hypertension Father   . Breast cancer Maternal Grandmother 60       reoccured 3 xs  . Diabetes Mellitus II Maternal Grandfather   . Heart disease Maternal Grandfather     Social History   Socioeconomic History  . Marital status: Married    Spouse name: Not on file  . Number of children: Not on file  . Years of education: Not on file  . Highest education level: Not on file  Occupational History  . Not on file  Tobacco Use  . Smoking status: Former Smoker     Packs/day: 1.00    Years: 10.00    Pack years: 10.00    Quit date: 05/12/2017    Years since quitting: 3.4  . Smokeless tobacco: Never Used  Vaping Use  . Vaping Use: Never used  Substance and Sexual Activity  . Alcohol use: Not Currently  . Drug use: No  . Sexual activity: Yes    Birth control/protection: Pill  Other Topics Concern  . Not on file  Social History Narrative  . Not on file   Social Determinants of Health   Financial Resource Strain: Not on file  Food Insecurity: Not on file  Transportation Needs: Not on file  Physical Activity: Not on file  Stress: Not on file  Social Connections: Not on file  Intimate Partner Violence: Not on file    Current Meds  Medication Sig  . [DISCONTINUED] levonorgestrel-ethinyl estradiol (QUASENSE) 0.15-0.03 MG tablet Take 1 tablet by mouth daily.     ROS:  Review of Systems  Constitutional: Negative for fatigue, fever and unexpected weight change.  Respiratory: Negative for cough, shortness of breath and wheezing.   Cardiovascular: Negative for chest pain, palpitations and leg swelling.  Gastrointestinal: Negative for blood in stool, constipation, diarrhea, nausea and vomiting.  Endocrine: Negative for cold intolerance, heat intolerance and  polyuria.  Genitourinary: Negative for dyspareunia, dysuria, flank pain, frequency, genital sores, hematuria, menstrual problem, pelvic pain, urgency, vaginal bleeding, vaginal discharge and vaginal pain.  Musculoskeletal: Negative for back pain, joint swelling and myalgias.  Skin: Negative for rash.  Neurological: Negative for dizziness, syncope, light-headedness, numbness and headaches.  Hematological: Negative for adenopathy.  Psychiatric/Behavioral: Negative for agitation, confusion, sleep disturbance and suicidal ideas. The patient is not nervous/anxious.      Objective: BP 130/90   Ht 5\' 7"  (1.702 m)   Wt 176 lb (79.8 kg)   LMP 09/25/2020 (Approximate)   BMI 27.57 kg/m     Physical Exam Constitutional:      Appearance: She is well-developed.  Genitourinary:     Vulva normal.     Right Labia: No rash, tenderness or lesions.    Left Labia: No tenderness, lesions or rash.    No vaginal discharge, erythema or tenderness.      Right Adnexa: not tender and no mass present.    Left Adnexa: not tender and no mass present.    No cervical friability or polyp.     Uterus is not enlarged or tender.  Breasts:     Right: No mass, nipple discharge, skin change or tenderness.     Left: No mass, nipple discharge, skin change or tenderness.    Neck:     Thyroid: No thyromegaly.  Cardiovascular:     Rate and Rhythm: Normal rate and regular rhythm.     Heart sounds: Normal heart sounds. No murmur heard.   Pulmonary:     Effort: Pulmonary effort is normal.     Breath sounds: Normal breath sounds.  Abdominal:     Palpations: Abdomen is soft.     Tenderness: There is no abdominal tenderness. There is no guarding or rebound.  Musculoskeletal:        General: Normal range of motion.     Cervical back: Normal range of motion.  Lymphadenopathy:     Cervical: No cervical adenopathy.  Neurological:     General: No focal deficit present.     Mental Status: She is alert and oriented to person, place, and time.     Cranial Nerves: No cranial nerve deficit.  Skin:    General: Skin is warm and dry.  Psychiatric:        Mood and Affect: Mood normal.        Behavior: Behavior normal.        Thought Content: Thought content normal.        Judgment: Judgment normal.  Vitals reviewed.     Assessment/Plan: Encounter for annual routine gynecological examination  Encounter for surveillance of contraceptive pills - Plan: levonorgestrel-ethinyl estradiol (QUASENSE) 0.15-0.03 MG tablet; OCP RF  Family history of breast cancer--Myrisk testing discussed and done today. Will call with results. Handout given.     Meds ordered this encounter  Medications  .  levonorgestrel-ethinyl estradiol (QUASENSE) 0.15-0.03 MG tablet    Sig: Take 1 tablet by mouth daily.    Dispense:  91 tablet    Refill:  3    Order Specific Question:   Supervising Provider    Answer:   05[birth year]1997 Nadara Mustard             GYN counsel adequate intake of calcium and vitamin D, diet and exercise     F/U  Return in about 1 year (around 10/16/2021).  Ardelle Haliburton B. Adonis Yim, PA-C 10/16/2020 10:57 AM

## 2020-10-31 ENCOUNTER — Encounter: Payer: Self-pay | Admitting: Obstetrics and Gynecology

## 2020-11-02 ENCOUNTER — Telehealth: Payer: Self-pay | Admitting: Obstetrics and Gynecology

## 2020-11-02 DIAGNOSIS — Z803 Family history of malignant neoplasm of breast: Secondary | ICD-10-CM

## 2020-11-02 DIAGNOSIS — Z1231 Encounter for screening mammogram for malignant neoplasm of breast: Secondary | ICD-10-CM

## 2020-11-02 DIAGNOSIS — Z9189 Other specified personal risk factors, not elsewhere classified: Secondary | ICD-10-CM

## 2020-11-02 NOTE — Telephone Encounter (Signed)
Pt is MyRisk neg. IBIS=20.3%/riskscore=20.1%. Pt aware of recommendations of monthly SBE, yearly CBE and mammos. Can also do scr breast MRI. Will sched mammo first, then f/u with MRI if pt interested.   Patient understands these results only apply to her and her children, and this is not indicative of genetic testing results of her other family members. It is recommended that her other family members have genetic testing done.  Pt also understands negative genetic testing doesn't mean she will never get any of these cancers.   Hard copy mailed to pt. F/u prn.

## 2020-12-22 ENCOUNTER — Other Ambulatory Visit: Payer: Self-pay | Admitting: Obstetrics and Gynecology

## 2020-12-22 DIAGNOSIS — Z3041 Encounter for surveillance of contraceptive pills: Secondary | ICD-10-CM

## 2021-08-22 ENCOUNTER — Ambulatory Visit: Payer: BC Managed Care – PPO | Admitting: Obstetrics and Gynecology

## 2021-08-22 ENCOUNTER — Encounter: Payer: Self-pay | Admitting: Obstetrics and Gynecology

## 2021-08-22 ENCOUNTER — Other Ambulatory Visit: Payer: Self-pay

## 2021-08-22 VITALS — BP 110/70 | Ht 66.0 in | Wt 187.0 lb

## 2021-08-22 DIAGNOSIS — Z803 Family history of malignant neoplasm of breast: Secondary | ICD-10-CM | POA: Diagnosis not present

## 2021-08-22 DIAGNOSIS — N644 Mastodynia: Secondary | ICD-10-CM | POA: Diagnosis not present

## 2021-08-22 DIAGNOSIS — Z9189 Other specified personal risk factors, not elsewhere classified: Secondary | ICD-10-CM | POA: Diagnosis not present

## 2021-08-22 DIAGNOSIS — Z1231 Encounter for screening mammogram for malignant neoplasm of breast: Secondary | ICD-10-CM | POA: Diagnosis not present

## 2021-08-22 NOTE — Progress Notes (Addendum)
Jessica Cohen, No Pcp Per (Inactive)   Chief Complaint  Jessica Cohen presents with   Breast Exam    Severe pain on RB since yesterday, lump on RB that is painful    HPI:      Ms. Teleshia Lemere is a 35 y.o. [id number] whose LMP was Jessica Cohen's last menstrual period was 06/25/2021 (approximate)., presents today for RT breast pain that started spontaneously yesterday. Getting worse. Not improved with heating pad/NSAIDs. No erythema, trauma, fever. No discrete mass but area feels different than LT breast and different than normal SBE. Drinks lots of caffeine, no change for pt. Hx of cracked RT nipple, improved with lanolin oint if used daily. Sometimes has crusty d/c on nipple, sometimes bleeds from crack. Uses soap to wash breasts, no longer using dryer sheets (have discussed this before). Works from home so rarely wears bra. No prior breast issues, no prior breast imaging.   FH breast cancer in MGM 3 times. Pt is MyRisk neg. IBIS=20.3%/riskscore=20.1%. Pt aware of recommendations of monthly SBE, yearly CBE and mammos. Hasn't had mammo yet.   Past Medical History:  Diagnosis Date   BRCA negative 09/2020   MyRisk neg   Family history of breast cancer    Genital warts    History of Papanicolaou smear of cervix 08/01/2014   NIL   Increased risk of breast cancer 09/2020   IBIS=20.3%/riskscore=20.1%   Urinary tract infection     Past Surgical History:  Procedure Laterality Date   WISDOM TOOTH EXTRACTION      Family History  Problem Relation Age of Onset   Hypertension Mother    Hypertension Father    Breast cancer Maternal Grandmother 8       reoccured 3 xs   Diabetes Mellitus II Maternal Grandfather    Heart disease Maternal Grandfather     Social History   Socioeconomic History   Marital status: Married    Spouse name: Not on file   Number of children: Not on file   Years of education: Not on file   Highest education level: Not on file  Occupational History   Not on file   Tobacco Use   Smoking status: Former    Packs/day: 1.00    Years: 10.00    Pack years: 10.00    Types: Cigarettes    Quit date: 05/12/2017    Years since quitting: 4.2   Smokeless tobacco: Never  Vaping Use   Vaping Use: Never used  Substance and Sexual Activity   Alcohol use: Not Currently   Drug use: No   Sexual activity: Yes    Birth control/protection: Pill  Other Topics Concern   Not on file  Social History Narrative   Not on file   Social Determinants of Health   Financial Resource Strain: Not on file  Food Insecurity: Not on file  Transportation Needs: Not on file  Physical Activity: Not on file  Stress: Not on file  Social Connections: Not on file  Intimate Partner Violence: Not on file    Outpatient Medications Prior to Visit  Medication Sig Dispense Refill   levonorgestrel-ethinyl estradiol (SEASONALE) 0.15-0.03 MG tablet TAKE 1 TABLET BY MOUTH DAILY 91 tablet 3   No facility-administered medications prior to visit.      ROS:  Review of Systems  Constitutional:  Negative for fever.  Gastrointestinal:  Negative for blood in stool, constipation, diarrhea, nausea and vomiting.  Genitourinary:  Negative for dyspareunia, dysuria, flank pain, frequency, hematuria, urgency, vaginal  bleeding, vaginal discharge and vaginal pain.  Musculoskeletal:  Negative for back pain.  Skin:  Negative for rash.  BREAST: No symptoms   OBJECTIVE:   Vitals:  BP 110/70   Ht _0  (1.676 m)   Wt 187 lb (84.8 kg)   LMP 06/25/2021 (Approximate)   BMI 30.18 kg/m   Physical Exam Vitals reviewed.  Pulmonary:     Effort: Pulmonary effort is normal.  Chest:  Breasts:    Breasts are symmetrical.     Right: Mass and tenderness present. No inverted nipple, nipple discharge or skin change.     Left: No inverted nipple, mass, nipple discharge, skin change or tenderness.    Musculoskeletal:        General: Normal range of motion.     Cervical back: Normal range of motion.   Skin:    General: Skin is warm and dry.  Neurological:     General: No focal deficit present.     Mental Status: She is alert and oriented to person, place, and time.     Cranial Nerves: No cranial nerve deficit.  Psychiatric:        Mood and Affect: Mood normal.        Behavior: Behavior normal.        Thought Content: Thought content normal.        Judgment: Judgment normal.    Assessment/Plan: Breast pain, right - Plan: MM DIAG BREAST TOMO BILATERAL, US BREAST LTD UNI RIGHT INC AXILLA, US BREAST LTD UNI LEFT INC AXILLA; very tender to palpate, no discrete mass, no evid of infection, but sx seem more c/w infection. Will sched mammo and u/s ASAP. Per [place], will do light pressure on mammo due to pain. Cont NSAIDs/heat vs ice. F/u if redness develops for abx tx in meantime.   Encounter for screening mammogram for malignant neoplasm of breast - Plan: MM DIAG BREAST TOMO BILATERAL, US BREAST LTD UNI RIGHT INC AXILLA, US BREAST LTD UNI LEFT INC AXILLA  Family history of breast cancer - Plan: MM DIAG BREAST TOMO BILATERAL, US BREAST LTD UNI RIGHT INC AXILLA, US BREAST LTD UNI LEFT INC AXILLA  Increased risk of breast cancer - Plan: MM DIAG BREAST TOMO BILATERAL, US BREAST LTD UNI RIGHT INC AXILLA, US BREAST LTD UNI LEFT INC AXILLA    Return if symptoms worsen or fail to improve.  Jazae Gandolfi B. Khloey Chern, PA-C 08/22/2021 10:48 AM    ADDENDUM: [facility] Poli unable to see pt till 08/31/21. I reached out to Dr. Bary Castilla and he will see pt tomorrow at 3:45. Pt aware. Notes/demographics faxed to his office.

## 2021-08-22 NOTE — Addendum Note (Signed)
Addended by: Althea Grimmer B on: 08/22/2021 11:39 AM   Modules accepted: Orders

## 2021-08-23 ENCOUNTER — Other Ambulatory Visit: Payer: Self-pay | Admitting: Obstetrics and Gynecology

## 2021-08-23 DIAGNOSIS — N644 Mastodynia: Secondary | ICD-10-CM

## 2021-09-05 ENCOUNTER — Ambulatory Visit
Admission: RE | Admit: 2021-09-05 | Discharge: 2021-09-05 | Disposition: A | Discharge status: Home / Self Care | Payer: BC Managed Care – PPO | Source: Ambulatory Visit | Attending: Obstetrics and Gynecology | Admitting: Obstetrics and Gynecology

## 2021-09-05 ENCOUNTER — Other Ambulatory Visit: Payer: Self-pay

## 2021-09-05 DIAGNOSIS — N644 Mastodynia: Secondary | ICD-10-CM

## 2021-09-05 DIAGNOSIS — R922 Inconclusive mammogram: Secondary | ICD-10-CM | POA: Diagnosis not present

## 2021-10-18 ENCOUNTER — Ambulatory Visit: Payer: BC Managed Care – PPO | Admitting: Obstetrics and Gynecology

## 2021-12-19 ENCOUNTER — Other Ambulatory Visit: Payer: Self-pay | Admitting: Obstetrics and Gynecology

## 2021-12-19 DIAGNOSIS — Z3041 Encounter for surveillance of contraceptive pills: Secondary | ICD-10-CM

## 2021-12-24 ENCOUNTER — Other Ambulatory Visit: Payer: Self-pay

## 2021-12-24 DIAGNOSIS — Z3041 Encounter for surveillance of contraceptive pills: Secondary | ICD-10-CM

## 2021-12-24 MED ORDER — LEVONORGEST-ETH ESTRAD 91-DAY 0.15-0.03 MG PO TABS: 1.0000 | ORAL_TABLET | Freq: Every day | ORAL | 0 refills | Status: DC

## 2021-12-27 ENCOUNTER — Other Ambulatory Visit: Payer: Self-pay

## 2021-12-27 ENCOUNTER — Other Ambulatory Visit (HOSPITAL_COMMUNITY)
Admission: RE | Admit: 2021-12-27 | Discharge: 2021-12-27 | Disposition: A | Discharge status: Home / Self Care | Payer: BC Managed Care – PPO | Source: Ambulatory Visit | Attending: Obstetrics and Gynecology | Admitting: Obstetrics and Gynecology

## 2021-12-27 ENCOUNTER — Encounter: Payer: Self-pay | Admitting: Obstetrics and Gynecology

## 2021-12-27 ENCOUNTER — Ambulatory Visit (INDEPENDENT_AMBULATORY_CARE_PROVIDER_SITE_OTHER): Payer: BC Managed Care – PPO | Admitting: Obstetrics and Gynecology

## 2021-12-27 VITALS — BP 110/80 | Ht 67.0 in | Wt 185.0 lb

## 2021-12-27 DIAGNOSIS — Z3041 Encounter for surveillance of contraceptive pills: Secondary | ICD-10-CM | POA: Diagnosis not present

## 2021-12-27 DIAGNOSIS — Z1151 Encounter for screening for human papillomavirus (HPV): Secondary | ICD-10-CM | POA: Insufficient documentation

## 2021-12-27 DIAGNOSIS — Z124 Encounter for screening for malignant neoplasm of cervix: Secondary | ICD-10-CM | POA: Insufficient documentation

## 2021-12-27 DIAGNOSIS — Z9189 Other specified personal risk factors, not elsewhere classified: Secondary | ICD-10-CM

## 2021-12-27 DIAGNOSIS — Z01419 Encounter for gynecological examination (general) (routine) without abnormal findings: Secondary | ICD-10-CM

## 2021-12-27 DIAGNOSIS — Z803 Family history of malignant neoplasm of breast: Secondary | ICD-10-CM

## 2021-12-27 DIAGNOSIS — Z1231 Encounter for screening mammogram for malignant neoplasm of breast: Secondary | ICD-10-CM

## 2021-12-27 MED ORDER — LEVONORGEST-ETH ESTRAD 91-DAY 0.15-0.03 MG PO TABS: 1.0000 | ORAL_TABLET | Freq: Every day | ORAL | 3 refills | Status: DC

## 2021-12-27 NOTE — Progress Notes (Signed)
? ?PCP:  Patient, No Pcp Per (Inactive) ? ? ?Chief Complaint  ?Patient presents with  ? Gynecologic Exam  ?  No concerns  ? ? ? ?HPI: ?     [name] is a 35 y.o. G3P0021 who LMP was Patient's last menstrual period was 09/28/2021 (exact date)., presents today for her annual examination.  Her menses are regular every 3 months with OCPs, lasting 3 days.  Dysmenorrhea none. She does not have intermenstrual bleeding now; did have some in past with late pills. ? ?Sex activity: single partner, contraception - OCP (estrogen/progesterone).  ?Last Pap: 08/14/17  Results were: no abnormalities /neg HPV DNA ?Hx of STDs: ext HPV, exposure from husband. Treated with TCA with sx relief. No sx now. ? ?Mammogram: 09/05/21  Results were normal with benign RT breast cysts after addl imaging, repeat in 12 months. Was having pain, sx improved but area still feels "swollen" ?There is a FH of breast cancer in her MGM 3 times now. Pt is MyRisk testing neg 12/21; IBIS=20.3%/riskscore=20.1%. There is no FH of ovarian cancer. The patient does do self-breast exams. Hasn't had screening breast MRI. Not taking Vit D supp.  ? ?Tobacco use: The patient denies current or previous tobacco use. ?Alcohol use: none ?No drug use.  ?Exercise: min active ? ?She does get adequate calcium but not Vitamin D in her diet. ? ? ?Past Medical History:  ?Diagnosis Date  ? BRCA negative 09/2020  ? MyRisk neg  ? Family history of breast cancer   ? Genital warts   ? History of Papanicolaou smear of cervix 08/01/2014  ? NIL  ? Increased risk of breast cancer 09/2020  ? IBIS=20.3%/riskscore=20.1%  ? Urinary tract infection   ? ? ?Past Surgical History:  ?Procedure Laterality Date  ? WISDOM TOOTH EXTRACTION    ? ? ?Family History  ?Problem Relation Age of Onset  ? Hypertension Mother   ? Hypertension Father   ? Breast cancer Maternal Grandmother 50  ?     reoccured 3 xs  ? Diabetes Mellitus II Maternal Grandfather   ? Heart disease Maternal Grandfather    ? ? ?Social History  ? ?Socioeconomic History  ? Marital status: Married  ?  Spouse name: Not on file  ? Number of children: Not on file  ? Years of education: Not on file  ? Highest education level: Not on file  ?Occupational History  ? Not on file  ?Tobacco Use  ? Smoking status: Former  ?  Packs/day: 1.00  ?  Years: 10.00  ?  Pack years: 10.00  ?  Types: Cigarettes  ?  Quit date: 05/12/2017  ?  Years since quitting: 4.6  ? Smokeless tobacco: Never  ?Vaping Use  ? Vaping Use: Never used  ?Substance and Sexual Activity  ? Alcohol use: Not Currently  ? Drug use: No  ? Sexual activity: Yes  ?  Birth control/protection: Pill  ?Other Topics Concern  ? Not on file  ?Social History Narrative  ? Not on file  ? ?Social Determinants of Health  ? ?Financial Resource Strain: Not on file  ?Food Insecurity: Not on file  ?Transportation Needs: Not on file  ?Physical Activity: Not on file  ?Stress: Not on file  ?Social Connections: Not on file  ?Intimate Partner Violence: Not on file  ? ? ?Current Meds  ?Medication Sig  ? [DISCONTINUED] levonorgestrel-ethinyl estradiol (SEASONALE) 0.15-0.03 MG tablet Take 1 tablet by mouth daily.  ? ? ? ?ROS: ? ?  Review of Systems  ?Constitutional:  Negative for fatigue, fever and unexpected weight change.  ?Respiratory:  Negative for cough, shortness of breath and wheezing.   ?Cardiovascular:  Negative for chest pain, palpitations and leg swelling.  ?Gastrointestinal:  Negative for blood in stool, constipation, diarrhea, nausea and vomiting.  ?Endocrine: Negative for cold intolerance, heat intolerance and polyuria.  ?Genitourinary:  Negative for dyspareunia, dysuria, flank pain, frequency, genital sores, hematuria, menstrual problem, pelvic pain, urgency, vaginal bleeding, vaginal discharge and vaginal pain.  ?Musculoskeletal:  Negative for back pain, joint swelling and myalgias.  ?Skin:  Negative for rash.  ?Neurological:  Negative for dizziness, syncope, light-headedness, numbness and  headaches.  ?Hematological:  Negative for adenopathy.  ?Psychiatric/Behavioral:  Negative for agitation, confusion, sleep disturbance and suicidal ideas. The patient is not nervous/anxious.   ? ? ?Objective: ?BP 110/80   Ht 5' 7" (1.702 m)   Wt 185 lb (83.9 kg)   LMP 09/28/2021 (Exact Date)   BMI 28.98 kg/m?  ? ? ?Physical Exam ?Constitutional:   ?   Appearance: She is well-developed.  ?Genitourinary:  ?   Vulva normal.  ?   Right Labia: No rash, tenderness or lesions. ?   Left Labia: No tenderness, lesions or rash. ?   No vaginal discharge, erythema or tenderness.  ? ?   Right Adnexa: not tender and no mass present. ?   Left Adnexa: not tender and no mass present. ?   No cervical friability or polyp.  ?   Uterus is not enlarged or tender.  ?Breasts: ?   Right: No mass, nipple discharge, skin change or tenderness.  ?   Left: No mass, nipple discharge, skin change or tenderness.  ?Neck:  ?   Thyroid: No thyromegaly.  ?Cardiovascular:  ?   Rate and Rhythm: Normal rate and regular rhythm.  ?   Heart sounds: Normal heart sounds. No murmur heard. ?Pulmonary:  ?   Effort: Pulmonary effort is normal.  ?   Breath sounds: Normal breath sounds.  ?Chest:  ? ? ?Abdominal:  ?   Palpations: Abdomen is soft.  ?   Tenderness: There is no abdominal tenderness. There is no guarding or rebound.  ?Musculoskeletal:     ?   General: Normal range of motion.  ?   Cervical back: Normal range of motion.  ?Lymphadenopathy:  ?   Cervical: No cervical adenopathy.  ?Neurological:  ?   General: No focal deficit present.  ?   Mental Status: She is alert and oriented to person, place, and time.  ?   Cranial Nerves: No cranial nerve deficit.  ?Skin: ?   General: Skin is warm and dry.  ?Psychiatric:     ?   Mood and Affect: Mood normal.     ?   Behavior: Behavior normal.     ?   Thought Content: Thought content normal.     ?   Judgment: Judgment normal.  ?Vitals reviewed.  ? ? ?Assessment/Plan: ?Encounter for annual routine gynecological  examination ? ?Cervical cancer screening - Plan: Cytology - PAP ? ?Screening for HPV (human papillomavirus) - Plan: Cytology - PAP ? ?Encounter for surveillance of contraceptive pills - Plan: levonorgestrel-ethinyl estradiol (SEASONALE) 0.15-0.03 MG tablet; OCP RF ? ?Encounter for screening mammogram for malignant neoplasm of breast - Plan: MM 3D SCREEN BREAST BILATERAL, pt to schedule mammo ? ?Family history of breast cancer - Plan: MM 3D SCREEN BREAST BILATERAL, Pt is MyRisk neg.  ? ?Increased risk of breast cancer -  Plan: MM 3D SCREEN BREAST BILATERAL, pt aware of recommendations of monthly SBE, yearly CBE and mammos as well as scr breast MRI. Cont Vit D supp. F/u prn.  ? ? ?Meds ordered this encounter  ?Medications  ? levonorgestrel-ethinyl estradiol (SEASONALE) 0.15-0.03 MG tablet  ?  Sig: Take 1 tablet by mouth daily.  ?  Dispense:  91 tablet  ?  Refill:  3  ?  Order Specific Question:   Supervising Provider  ?  Answer:   HARRIS, ROBERT P [[id number]]  ? ?          ?GYN counsel adequate intake of calcium and vitamin D, diet and exercise ? ? ?  F/U ? Return in about 1 year (around 12/28/2022). ? ?Alicia B. Copland, PA-C ?12/27/2021 ?4:14 PM ?

## 2021-12-31 LAB — CYTOLOGY - PAP
Comment: NEGATIVE
Diagnosis: NEGATIVE
High risk HPV: NEGATIVE

## 2022-03-01 ENCOUNTER — Other Ambulatory Visit: Payer: Self-pay

## 2022-03-01 ENCOUNTER — Encounter: Payer: Self-pay | Admitting: Emergency Medicine

## 2022-03-01 ENCOUNTER — Encounter (HOSPITAL_COMMUNITY): Payer: Self-pay

## 2022-03-01 ENCOUNTER — Ambulatory Visit
Admission: EM | Admit: 2022-03-01 | Discharge: 2022-03-01 | Disposition: A | Discharge status: Home / Self Care | Payer: BC Managed Care – PPO | Attending: Emergency Medicine | Admitting: Emergency Medicine

## 2022-03-01 DIAGNOSIS — J029 Acute pharyngitis, unspecified: Secondary | ICD-10-CM | POA: Diagnosis not present

## 2022-03-01 DIAGNOSIS — J0140 Acute pansinusitis, unspecified: Secondary | ICD-10-CM | POA: Diagnosis not present

## 2022-03-01 LAB — GROUP A STREP BY PCR: Group A Strep by PCR: NOT DETECTED

## 2022-03-01 MED ORDER — IBUPROFEN 600 MG PO TABS: 600.0000 mg | ORAL_TABLET | Freq: Four times a day (QID) | ORAL | 0 refills | Status: DC | PRN

## 2022-03-01 MED ORDER — AMOXICILLIN-POT CLAVULANATE 875-125 MG PO TABS: 1.0000 | ORAL_TABLET | Freq: Two times a day (BID) | ORAL | 0 refills | Status: DC

## 2022-03-01 MED ORDER — FLUTICASONE PROPIONATE 50 MCG/ACT NA SUSP: 2.0000 | Freq: Every day | NASAL | 0 refills | Status: DC

## 2022-03-01 NOTE — ED Triage Notes (Signed)
Patient c/o ear pain, nasal congestion, runny nose that started on Monday.  Patient denies fevers.  ?

## 2022-03-01 NOTE — Discharge Instructions (Addendum)
Flonase, discontinue Claritin/Zyrtec/Allegra, start Mucinex D, saline nasal irrigation, Augmentin for 7 days for sinusitis.  Finish the Augmentin, even if you feel better. ? ? ?I will contact you if and only if your strep is positive and I need to extend your antibiotics.  1 gram of Tylenol and 600 mg ibuprofen together 3-4 times a day as needed for pain.  Make sure you drink plenty of extra fluids.  Some people find salt water gargles and  Traditional Medicinal's "Throat Coat" tea helpful. Take 5 mL of liquid Benadryl and 5 mL of Maalox. Mix it together, and then hold it in your mouth for as long as you can and then swallow. You may do this 4 times a day.   ? ?Here is a list of primary care providers who are taking new patients: ? ?[facility] [place] ?Dr. Joseph Berkshire (sports medicine) ?Dr. Elizabeth Sauer ?[address] ?[address] ?[place] [address] ?[phone] ? ?[facility] ?[address], [place] [address] ?[phone] ? ?[facility] [place] ?[address] [place] [address]  ?[place] [address]  ?[phone] ? ?[facility] ?[address]  ?[place], [place] [address] ?[phone] ? ?[facility] ?[address]  ?[phone] ?[place], [place] [address] ? ?Here are clinics/ other resources who will see you if you do not have insurance. Some have certain criteria that you must meet. Call them and find out what they are: ? ?[facility]: ?[address] [place], [place] [address] ?Phone: [phone] ?Hours: First and Third Saturdays of each Month, 9 a.m. - 1 p.m. ? ?[facility]: ?[address], [place], [place] [address] ?Phone: [phone] ?Hours: ?Tuesday, 4 p.m. - 8 p.m. ?Thursday, 1 p.m. - 8 p.m. ?Wednesday, 9 a.m. - Noon ? ?[facility] ?[address] [place], [place] [address] ?Phone: [phone] ?Pharmacy Phone Number: [phone] ?Dental Phone Number: [phone] ?ACA Insurance Help: [phone] ? ?Dental Hours: ?Monday -  Thursday, 8 a.m. - 6 p.m. ? ?[facility] ?[address] [place], [place] [address] ?Phone: [phone] ?Pharmacy Phone Number: [phone] ?ACA Insurance Help: [phone] ? ?[facility] ?[address] [place], [place] [address] ?Phone: [phone] ?Pharmacy Phone Number: [phone] ?ACA Insurance Help: [phone] ? ?[facility] ?[address] [place], [place] [address] ?Phone: [phone] ?ACA Insurance Help: [phone]  ? ?[facility] ?[address], [place], [place] [address] ?Phone: [phone] ? ?Go to www.goodrx.com  or www.costplusdrugs.com to look up your medications. This will give you a list of where you can find your prescriptions at the most affordable prices. Or ask the pharmacist what the cash price is, or if they have any other discount programs available to help make your medication more affordable. This can be less expensive than what you would pay with insurance.   ?

## 2022-03-01 NOTE — ED Provider Notes (Signed)
HPI ? ?SUBJECTIVE: ? ?Jessica Cohen is a 36 y.o. female who presents with 4 days of nasal congestion, sore throat, bilateral ear pain, body aches, headaches, yellow rhinorrhea, sinus pain and pressure, facial swelling, postnasal drip.  No fevers, upper dental pain, loss of sense of smell or taste, cough, wheezing, shortness of breath, nausea, vomiting, diarrhea, abdominal pain, rash.  She has had allergy symptoms for the past 3-weeks and traveled over the weekend.  No known COVID or flu exposure.  She did not get the COVID or flu vaccines.  No antibiotics in the past month.  No antipyretic in the past 6 hours.  She tried Claritin, Allegra, ibuprofen, and Synex decongestant spray without improvement in her symptoms.  Symptoms are worse with bending forward.  She has a past medical history of allergies.  No history of COVID, diabetes, hypertension.  LMP: 8 weeks ago.  Denies the possibility pregnant.  She is on 74-monthOCP cycle.  PCP: None ? ? ? ?Past Medical History:  ?Diagnosis Date  ? BRCA negative 09/2020  ? MyRisk neg  ? Family history of breast cancer   ? Genital warts   ? History of Papanicolaou smear of cervix 08/01/2014  ? NIL  ? Increased risk of breast cancer 09/2020  ? IBIS=20.3%/riskscore=20.1%  ? Urinary tract infection   ? ? ?Past Surgical History:  ?Procedure Laterality Date  ? WISDOM TOOTH EXTRACTION    ? ? ?Family History  ?Problem Relation Age of Onset  ? Hypertension Mother   ? Hypertension Father   ? Breast cancer Maternal Grandmother 52 ?     reoccured 3 xs  ? Diabetes Mellitus II Maternal Grandfather   ? Heart disease Maternal Grandfather   ? ? ?Social History  ? ?Tobacco Use  ? Smoking status: Former  ?  Packs/day: 1.00  ?  Years: 10.00  ?  Pack years: 10.00  ?  Types: Cigarettes  ?  Quit date: 05/12/2017  ?  Years since quitting: 4.8  ? Smokeless tobacco: Never  ?Vaping Use  ? Vaping Use: Never used  ?Substance Use Topics  ? Alcohol use: Not Currently  ? Drug use: No  ? ? ?No current  facility-administered medications for this encounter. ? ?Current Outpatient Medications:  ?  amoxicillin-clavulanate (AUGMENTIN) 875-125 MG tablet, Take 1 tablet by mouth 2 (two) times daily. X 7 days, Disp: 14 tablet, Rfl: 0 ?  fluticasone (FLONASE) 50 MCG/ACT nasal spray, Place 2 sprays into both nostrils daily., Disp: 16 g, Rfl: 0 ?  ibuprofen (ADVIL) 600 MG tablet, Take 1 tablet (600 mg total) by mouth every 6 (six) hours as needed., Disp: 30 tablet, Rfl: 0 ?  levonorgestrel-ethinyl estradiol (SEASONALE) 0.15-0.03 MG tablet, Take 1 tablet by mouth daily., Disp: 91 tablet, Rfl: 3 ? ?No Known Allergies ? ? ?ROS ? ?As noted in HPI.  ? ?Physical Exam ? ?BP (!) 142/76 (BP Location: Right Arm)   Pulse (!) 118   Temp 98.1 ?F (36.7 ?C) (Oral)   Resp 14   Ht '5\' 6"'  (1.676 m)   Wt 79.4 kg   SpO2 100%   BMI 28.25 kg/m?  ? ?Constitutional: Well developed, well nourished, no acute distress ?Eyes:  EOMI, conjunctiva normal bilaterally ?HENT: Normocephalic, atraumatic,mucus membranes moist.  TMs normal bilaterally.  Erythematous, swollen turbinates.  Purulent nasal congestion.  No maxillary, frontal sinus tenderness.  Tonsils normal size without exudates.  Normal oropharynx.  Uvula midline.  No obvious postnasal drip. ?Neck: Prominent bilateral cervical  lymphadenopathy ?Respiratory: Normal inspiratory effort, lungs clear bilaterally ?Cardiovascular: Regular tachycardia, no murmurs ?GI: nondistended ?skin: No rash, skin intact ?Musculoskeletal: no deformities ?Neurologic: Alert & oriented x 3, no focal neuro deficits ?Psychiatric: Speech and behavior appropriate ? ? ?ED Course ? ? ?Medications - No data to display ? ?Orders Placed This Encounter  ?Procedures  ? Group A Strep by PCR  ?  Standing Status:   Standing  ?  Number of Occurrences:   1  ? ? ?Results for orders placed or performed during the hospital encounter of 03/01/22 (from the past 24 hour(s))  ?Group A Strep by PCR     Status: None  ? Collection Time:  03/01/22  8:56 AM  ? Specimen: Throat; Sterile Swab  ?Result Value Ref Range  ? Group A Strep by PCR NOT DETECTED NOT DETECTED  ? ?No results found. ? ?ED Clinical Impression ? ?1. Acute non-recurrent pansinusitis   ?2. Sore throat   ?  ? ?ED Assessment/Plan ? ?Patient declined COVID and flu testing.  Because of a sore throat and cervical lymphadenopathy, will check strep.  Will contact patient at [phone] if strep is positive.  Will extend antibiotics by 3 days.  In the meantime, Flonase, discontinue antihistamines, start Mucinex D, saline nasal irrigation, Augmentin for 7 days for sinusitis, Benadryl/Maalox mixture as needed for sore throat.  Will provide primary care list and order assistance in finding a PCP. ? ?Strep PCR negative.  Plan as above. ? ?Discussed labs,  MDM, treatment plan, and plan for follow-up with patient. patient agrees with plan.  ? ?Meds ordered this encounter  ?Medications  ? fluticasone (FLONASE) 50 MCG/ACT nasal spray  ?  Sig: Place 2 sprays into both nostrils daily.  ?  Dispense:  16 g  ?  Refill:  0  ? ibuprofen (ADVIL) 600 MG tablet  ?  Sig: Take 1 tablet (600 mg total) by mouth every 6 (six) hours as needed.  ?  Dispense:  30 tablet  ?  Refill:  0  ? amoxicillin-clavulanate (AUGMENTIN) 875-125 MG tablet  ?  Sig: Take 1 tablet by mouth 2 (two) times daily. X 7 days  ?  Dispense:  14 tablet  ?  Refill:  0  ? ? ? ? ?*This clinic note was created using [employer]. Therefore, there may be occasional mistakes despite careful proofreading. ? ?? ? ?  ?Melynda Ripple, MD ?03/01/22 1226 ? ?

## 2022-04-17 DIAGNOSIS — H1045 Other chronic allergic conjunctivitis: Secondary | ICD-10-CM | POA: Diagnosis not present

## 2022-07-31 ENCOUNTER — Ambulatory Visit (INDEPENDENT_AMBULATORY_CARE_PROVIDER_SITE_OTHER): Payer: BLUE CROSS/BLUE SHIELD

## 2022-07-31 VITALS — BP 136/84 | HR 105 | Wt 184.0 lb

## 2022-07-31 DIAGNOSIS — R300 Dysuria: Secondary | ICD-10-CM | POA: Diagnosis not present

## 2022-07-31 LAB — POCT URINALYSIS DIPSTICK
Blood, UA: POSITIVE
Glucose, UA: NEGATIVE
Nitrite, UA: POSITIVE
Protein, UA: NEGATIVE
Spec Grav, UA: 1.01 (ref 1.010–1.025)
Urobilinogen, UA: 0.2 E.U./dL
pH, UA: 5.5 (ref 5.0–8.0)

## 2022-07-31 MED ORDER — NITROFURANTOIN MONOHYD MACRO 100 MG PO CAPS: 100.0000 mg | ORAL_CAPSULE | Freq: Two times a day (BID) | ORAL | 0 refills | Status: DC

## 2022-07-31 NOTE — Progress Notes (Signed)
Pt came in reporting uti symptoms dysuria and frequency. Pt reports having UTI in the past. Macrobid sent to pharmacy and urine culture sent to lab.

## 2022-08-02 LAB — URINE CULTURE: Organism ID, Bacteria: NO GROWTH

## 2022-12-23 IMAGING — MG DIGITAL DIAGNOSTIC BILAT W/ TOMO W/ CAD
6 of 10 series · 6 of 30 positions shown · non-contrast
Comparison: None.

CLINICAL DATA: Focal RIGHT breast pain. Patient reports hard knot
like area 2 weeks ago which has since resolved.

EXAM:
DIGITAL DIAGNOSTIC BILATERAL MAMMOGRAM WITH TOMOSYNTHESIS AND CAD;
ULTRASOUND RIGHT BREAST LIMITED
TECHNIQUE: Bilateral digital diagnostic mammography and breast tomosynthesis
was performed. The images were evaluated with computer-aided
detection.; Targeted ultrasound examination of the right breast was
performed

[R MLO synth-2D]
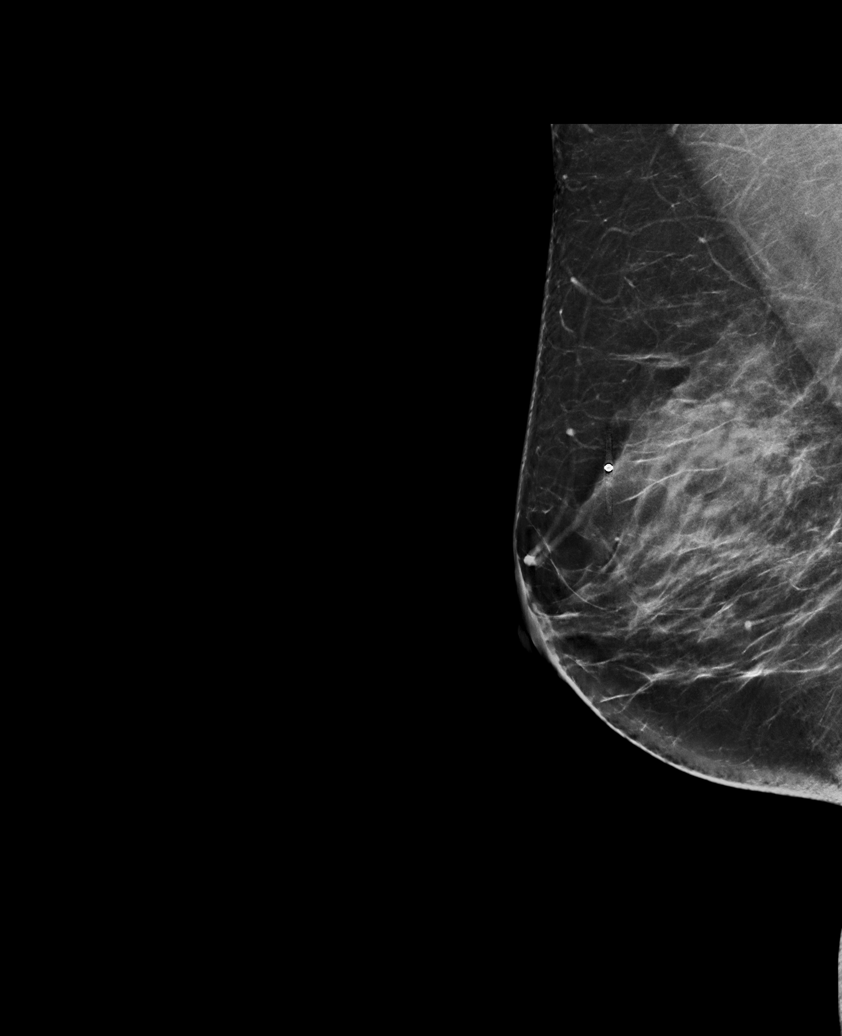

[R CC synth-2D (1 of 2)]
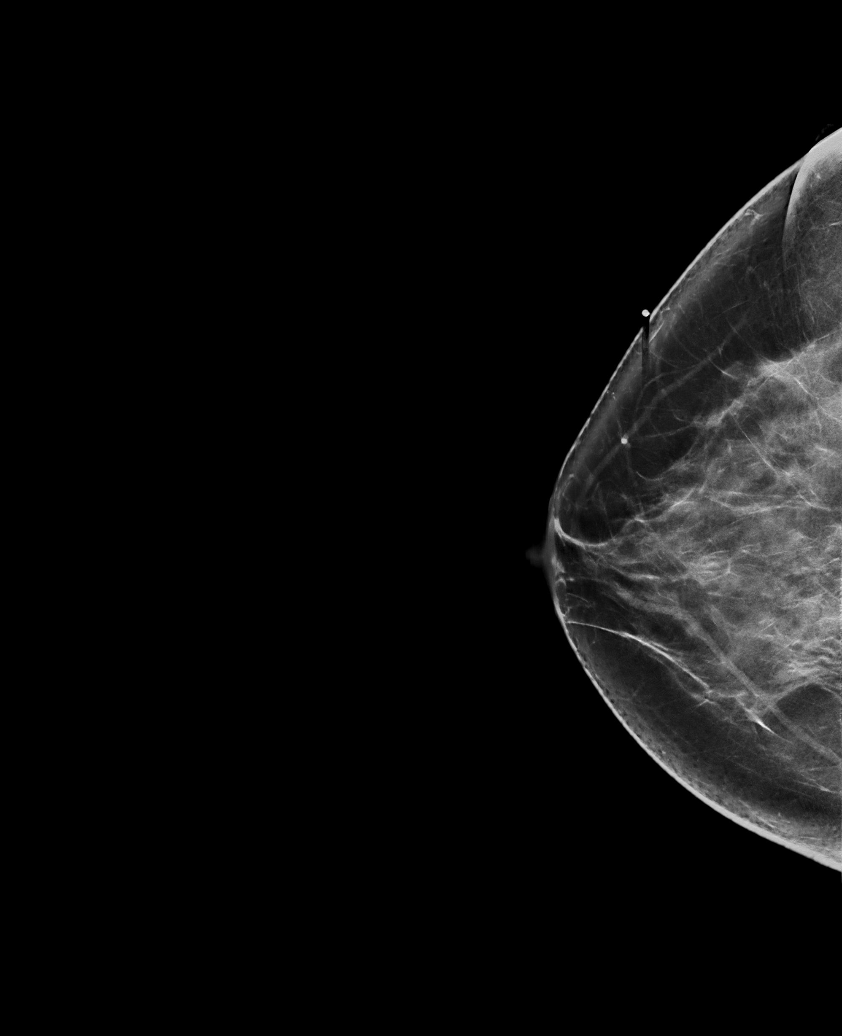

[L CC synth-2D]
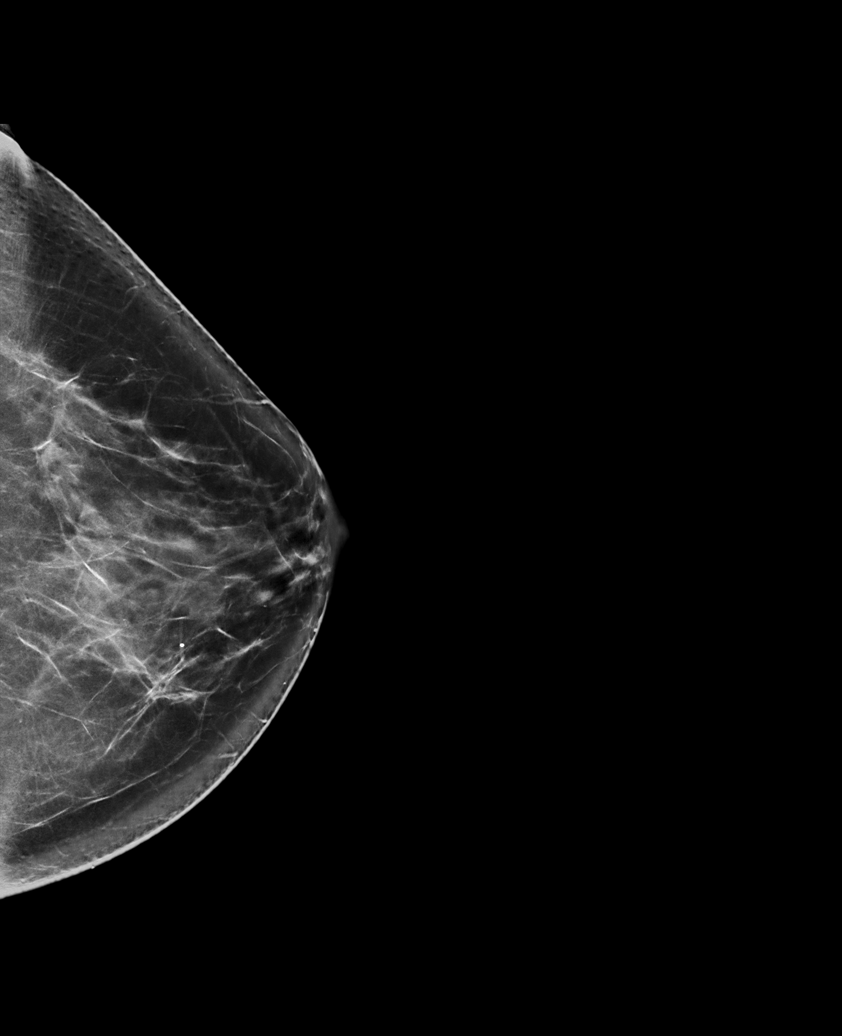

[L MLO synth-2D]
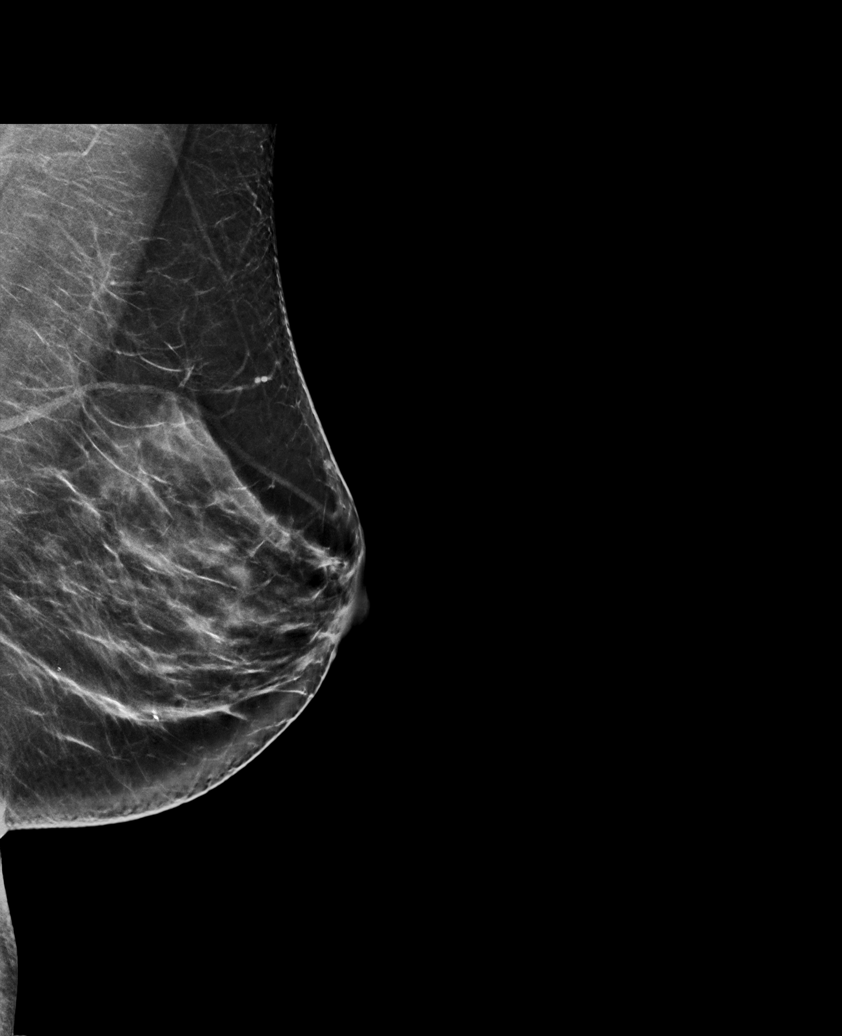

[R CC synth-2D (2 of 2)]
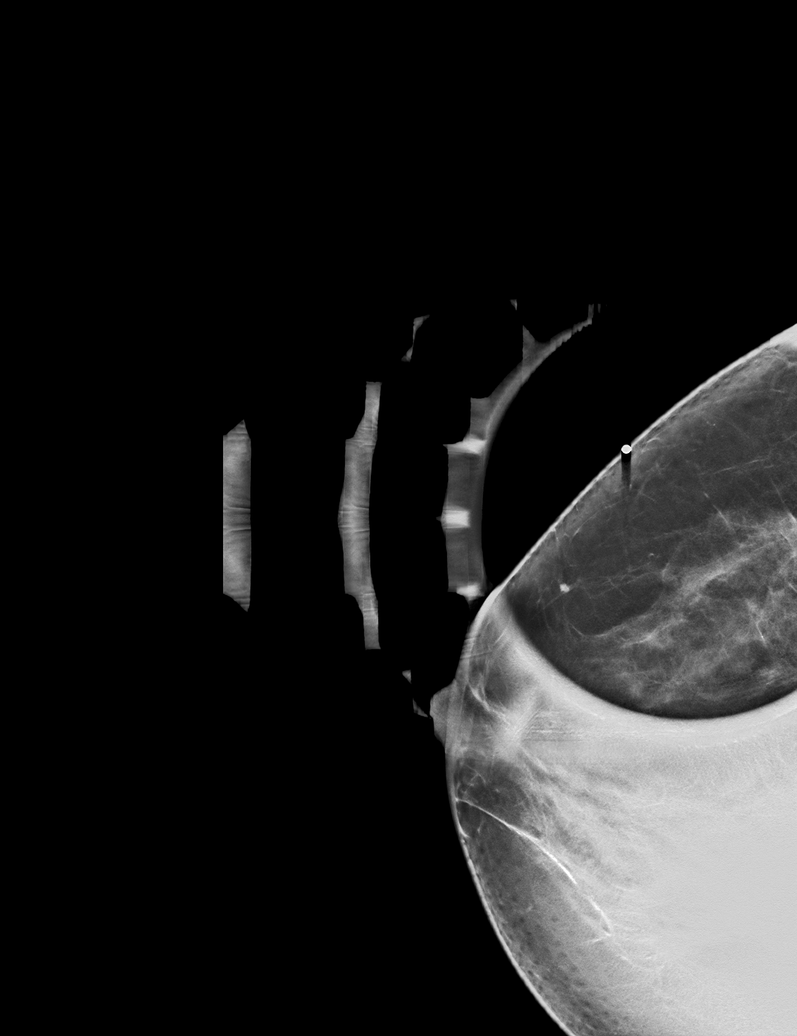

[R MLO tomo · tomo slice 41/80.0]
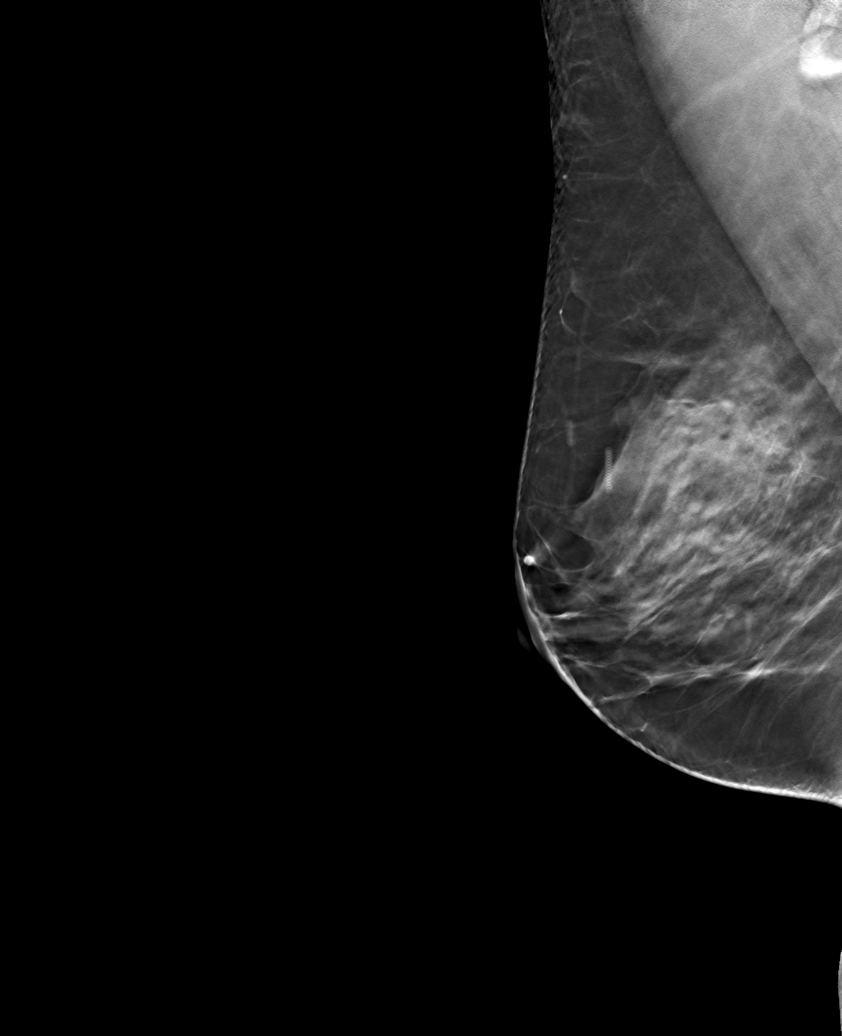

[6 of 30 positions shown; findings below may reference images not displayed]

Baseline

ACR Breast Density Category c: The breast tissue is heterogeneously
dense, which may obscure small masses.
FINDINGS: Spot compression tomosynthesis views were obtained over the area of
focal pain in the RIGHT breast. No suspicious mammographic finding
is identified in this area. No suspicious mass, microcalcification,
or other finding is identified in either breast.

On physical exam, no suspicious mass is appreciated.

Targeted RIGHT breast ultrasound was performed in the area of pain
at the outer breast. At 10 o'clock 6 cm from the nipple, there is an
oval circumscribed anechoic mass with posterior acoustic
enhancement. It measures 10 by 8 x 5 mm and is consistent with a
benign simple cyst. Multiple additional simple and minimally
complicated cysts were noted during real-time examination with
additional simple cyst documented adjacently at 10 o'clock 6 cm from
the nipple.
IMPRESSION: 1. There is a benign cyst at the site of concern. No mammographic
evidence of malignancy bilaterally. Any further workup of the
patient's symptoms should be based on the clinical assessment.
2. Given family history of breast cancer at a young age, recommend
evaluation for consideration of initiation of high risk screening
protocol.

RECOMMENDATION:
Recommend consideration for initiation of high risk screening
protocol. The American Cancer Society recommends annual MRI and
mammography in patients with an estimated lifetime risk of
developing breast cancer greater than 20 - 25%, or who are known or
suspected to be positive for the breast cancer gene.

I have discussed the findings and recommendations with the patient.
If applicable, a reminder letter will be sent to the patient
regarding the next appointment.

BI-RADS CATEGORY  2: Benign.

## 2022-12-30 ENCOUNTER — Telehealth: Payer: Self-pay

## 2022-12-30 ENCOUNTER — Other Ambulatory Visit: Payer: Self-pay

## 2022-12-30 ENCOUNTER — Other Ambulatory Visit: Payer: Self-pay | Admitting: Obstetrics and Gynecology

## 2022-12-30 DIAGNOSIS — Z3041 Encounter for surveillance of contraceptive pills: Secondary | ICD-10-CM

## 2022-12-30 MED ORDER — LEVONORGEST-ETH ESTRAD 91-DAY 0.15-0.03 MG PO TABS: 1.0000 | ORAL_TABLET | Freq: Every day | ORAL | 0 refills | Status: DC

## 2022-12-30 NOTE — Telephone Encounter (Signed)
Jessica Cohen called triage line requesting a refill on her birth control to hold her over until her appointment in April.   I sent in one refill only.

## 2023-01-29 NOTE — Progress Notes (Unsigned)
PCP:  Patient, No Pcp Per   No chief complaint on file.    HPI:      Jessica Cohen is a 37 y.o. [phone] who LMP was No LMP recorded. (Menstrual status: Oral contraceptives)., presents today for her annual examination.  Her menses are regular every 3 months with OCPs, lasting 3 days.  Dysmenorrhea none. She does not have intermenstrual bleeding now; did have some in past with late pills.  Sex activity: single partner, contraception - OCP (estrogen/progesterone).  Last Pap: 12/27/21  Results were: no abnormalities /neg HPV DNA Hx of STDs: ext HPV, exposure from husband. Treated with TCA with sx relief. No sx now.  Mammogram: 09/05/21  Results were normal with benign RT breast cysts after addl imaging, repeat in 12 months. Was having pain, sx improved but area still feels "swollen" There is a FH of breast cancer in her MGM 3 times now. Pt is MyRisk testing neg 12/21; IBIS=20.3%/riskscore=20.1%. There is no FH of ovarian cancer. The patient does do self-breast exams. Hasn't had screening breast MRI. Not taking Vit D supp.   Tobacco use: The patient denies current or previous tobacco use. Alcohol use: none No drug use.  Exercise: min active  She does get adequate calcium but not Vitamin D in her diet.   Past Medical History:  Diagnosis Date   BRCA negative 09/2020   MyRisk neg   Family history of breast cancer    Genital warts    History of Papanicolaou smear of cervix 08/01/2014   NIL   Increased risk of breast cancer 09/2020   IBIS=20.3%/riskscore=20.1%   Urinary tract infection     Past Surgical History:  Procedure Laterality Date   WISDOM TOOTH EXTRACTION      Family History  Problem Relation Age of Onset   Hypertension Mother    Hypertension Father    Breast cancer Maternal Grandmother 101       reoccured 3 xs   Diabetes Mellitus II Maternal Grandfather    Heart disease Maternal Grandfather     Social History   Socioeconomic History   Marital status:  Married    Spouse name: Not on file   Number of children: Not on file   Years of education: Not on file   Highest education level: Not on file  Occupational History   Not on file  Tobacco Use   Smoking status: Former    Packs/day: 1.00    Years: 10.00    Additional pack years: 0.00    Total pack years: 10.00    Types: Cigarettes    Quit date: 05/12/2017    Years since quitting: 5.7   Smokeless tobacco: Never  Vaping Use   Vaping Use: Never used  Substance and Sexual Activity   Alcohol use: Not Currently   Drug use: No   Sexual activity: Yes    Birth control/protection: Pill  Other Topics Concern   Not on file  Social History Narrative   Not on file   Social Determinants of Health   Financial Resource Strain: Not on file  Food Insecurity: Not on file  Transportation Needs: Not on file  Physical Activity: Not on file  Stress: Not on file  Social Connections: Not on file  Intimate Partner Violence: Not on file    No outpatient medications have been marked as taking for the 01/30/23 encounter (Appointment) with Jemila Camille, Deirdre Evener, PA-C.     ROS:  Review of Systems  Constitutional:  Negative  for fatigue, fever and unexpected weight change.  Respiratory:  Negative for cough, shortness of breath and wheezing.   Cardiovascular:  Negative for chest pain, palpitations and leg swelling.  Gastrointestinal:  Negative for blood in stool, constipation, diarrhea, nausea and vomiting.  Endocrine: Negative for cold intolerance, heat intolerance and polyuria.  Genitourinary:  Negative for dyspareunia, dysuria, flank pain, frequency, genital sores, hematuria, menstrual problem, pelvic pain, urgency, vaginal bleeding, vaginal discharge and vaginal pain.  Musculoskeletal:  Negative for back pain, joint swelling and myalgias.  Skin:  Negative for rash.  Neurological:  Negative for dizziness, syncope, light-headedness, numbness and headaches.  Hematological:  Negative for adenopathy.   Psychiatric/Behavioral:  Negative for agitation, confusion, sleep disturbance and suicidal ideas. The patient is not nervous/anxious.      Objective: There were no vitals taken for this visit.   Physical Exam Constitutional:      Appearance: She is well-developed.  Genitourinary:     Vulva normal.     Right Labia: No rash, tenderness or lesions.    Left Labia: No tenderness, lesions or rash.    No vaginal discharge, erythema or tenderness.      Right Adnexa: not tender and no mass present.    Left Adnexa: not tender and no mass present.    No cervical friability or polyp.     Uterus is not enlarged or tender.  Breasts:    Right: No mass, nipple discharge, skin change or tenderness.     Left: No mass, nipple discharge, skin change or tenderness.  Neck:     Thyroid: No thyromegaly.  Cardiovascular:     Rate and Rhythm: Normal rate and regular rhythm.     Heart sounds: Normal heart sounds. No murmur heard. Pulmonary:     Effort: Pulmonary effort is normal.     Breath sounds: Normal breath sounds.  Chest:    Abdominal:     Palpations: Abdomen is soft.     Tenderness: There is no abdominal tenderness. There is no guarding or rebound.  Musculoskeletal:        General: Normal range of motion.     Cervical back: Normal range of motion.  Lymphadenopathy:     Cervical: No cervical adenopathy.  Neurological:     General: No focal deficit present.     Mental Status: She is alert and oriented to person, place, and time.     Cranial Nerves: No cranial nerve deficit.  Skin:    General: Skin is warm and dry.  Psychiatric:        Mood and Affect: Mood normal.        Behavior: Behavior normal.        Thought Content: Thought content normal.        Judgment: Judgment normal.  Vitals reviewed.     Assessment/Plan: Encounter for annual routine gynecological examination  Cervical cancer screening - Plan: Cytology - PAP  Screening for HPV (human papillomavirus) - Plan:  Cytology - PAP  Encounter for surveillance of contraceptive pills - Plan: levonorgestrel-ethinyl estradiol (SEASONALE) 0.15-0.03 MG tablet; OCP RF  Encounter for screening mammogram for malignant neoplasm of breast - Plan: MM 3D SCREEN BREAST BILATERAL, pt to schedule mammo  Family history of breast cancer - Plan: MM 3D SCREEN BREAST BILATERAL, Pt is MyRisk neg.   Increased risk of breast cancer - Plan: MM 3D SCREEN BREAST BILATERAL, pt aware of recommendations of monthly SBE, yearly CBE and mammos as well as scr breast MRI. Cont Vit  D supp. F/u prn.    No orders of the defined types were placed in this encounter.            GYN counsel adequate intake of calcium and vitamin D, diet and exercise     F/U  No follow-ups on file.  Kramer Hanrahan B. Elodie Panameno, PA-C 01/29/2023 9:29 PM

## 2023-01-30 ENCOUNTER — Encounter: Payer: Self-pay | Admitting: Obstetrics and Gynecology

## 2023-01-30 ENCOUNTER — Ambulatory Visit (INDEPENDENT_AMBULATORY_CARE_PROVIDER_SITE_OTHER): Payer: BC Managed Care – PPO | Admitting: Obstetrics and Gynecology

## 2023-01-30 VITALS — BP 116/70 | Ht 67.0 in | Wt 179.0 lb

## 2023-01-30 DIAGNOSIS — Z803 Family history of malignant neoplasm of breast: Secondary | ICD-10-CM

## 2023-01-30 DIAGNOSIS — Z9189 Other specified personal risk factors, not elsewhere classified: Secondary | ICD-10-CM

## 2023-01-30 DIAGNOSIS — Z01419 Encounter for gynecological examination (general) (routine) without abnormal findings: Secondary | ICD-10-CM

## 2023-01-30 DIAGNOSIS — Z3041 Encounter for surveillance of contraceptive pills: Secondary | ICD-10-CM

## 2023-01-30 DIAGNOSIS — Z1231 Encounter for screening mammogram for malignant neoplasm of breast: Secondary | ICD-10-CM

## 2023-01-30 MED ORDER — LEVONORGEST-ETH ESTRAD 91-DAY 0.15-0.03 MG PO TABS: 1.0000 | ORAL_TABLET | Freq: Every day | ORAL | 3 refills | Status: DC

## 2023-01-30 NOTE — Patient Instructions (Signed)
I value your feedback and you entrusting us with your care. If you get a [place] patient survey, I would appreciate you taking the time to let us know about your experience today. Thank you!  [facility]: [phone]      

## 2023-04-01 ENCOUNTER — Other Ambulatory Visit: Payer: Self-pay | Admitting: Obstetrics and Gynecology

## 2023-04-01 DIAGNOSIS — Z3041 Encounter for surveillance of contraceptive pills: Secondary | ICD-10-CM

## 2023-04-08 ENCOUNTER — Other Ambulatory Visit: Payer: Self-pay

## 2023-04-08 NOTE — Telephone Encounter (Signed)
Pt calling to see why her refill of bc is being denied b/c she was just seen in April.  [phone]  Called [facility] in [place] and adv pt should have refills thru April 2025; they took a closer look and realized that was true and are getting refill ready for pt.  Pt aware.

## 2023-07-04 DIAGNOSIS — T23202A Burn of second degree of left hand, unspecified site, initial encounter: Secondary | ICD-10-CM | POA: Diagnosis not present

## 2023-07-04 DIAGNOSIS — T25222A Burn of second degree of left foot, initial encounter: Secondary | ICD-10-CM | POA: Diagnosis not present

## 2023-07-04 DIAGNOSIS — T23002A Burn of unspecified degree of left hand, unspecified site, initial encounter: Secondary | ICD-10-CM | POA: Diagnosis not present

## 2023-07-04 DIAGNOSIS — X12XXXA Contact with other hot fluids, initial encounter: Secondary | ICD-10-CM | POA: Diagnosis not present

## 2023-07-08 DIAGNOSIS — X101XXA Contact with hot food, initial encounter: Secondary | ICD-10-CM | POA: Diagnosis not present

## 2023-07-08 DIAGNOSIS — Z793 Long term (current) use of hormonal contraceptives: Secondary | ICD-10-CM | POA: Diagnosis not present

## 2023-07-08 DIAGNOSIS — Y93G3 Activity, cooking and baking: Secondary | ICD-10-CM | POA: Diagnosis not present

## 2023-07-08 DIAGNOSIS — T310 Burns involving less than 10% of body surface: Secondary | ICD-10-CM | POA: Diagnosis not present

## 2023-07-08 DIAGNOSIS — Z792 Long term (current) use of antibiotics: Secondary | ICD-10-CM | POA: Diagnosis not present

## 2023-07-08 DIAGNOSIS — T23232A Burn of second degree of multiple left fingers (nail), not including thumb, initial encounter: Secondary | ICD-10-CM | POA: Diagnosis not present

## 2023-07-08 DIAGNOSIS — T25322A Burn of third degree of left foot, initial encounter: Secondary | ICD-10-CM | POA: Diagnosis not present

## 2023-07-08 DIAGNOSIS — L03116 Cellulitis of left lower limb: Secondary | ICD-10-CM | POA: Diagnosis not present

## 2023-07-08 DIAGNOSIS — T25222A Burn of second degree of left foot, initial encounter: Secondary | ICD-10-CM | POA: Diagnosis not present

## 2023-07-15 DIAGNOSIS — T310 Burns involving less than 10% of body surface: Secondary | ICD-10-CM | POA: Diagnosis not present

## 2023-07-15 DIAGNOSIS — T23232A Burn of second degree of multiple left fingers (nail), not including thumb, initial encounter: Secondary | ICD-10-CM | POA: Diagnosis not present

## 2023-07-15 DIAGNOSIS — G8911 Acute pain due to trauma: Secondary | ICD-10-CM | POA: Diagnosis not present

## 2023-07-15 DIAGNOSIS — L299 Pruritus, unspecified: Secondary | ICD-10-CM | POA: Diagnosis not present

## 2023-07-15 DIAGNOSIS — X101XXA Contact with hot food, initial encounter: Secondary | ICD-10-CM | POA: Diagnosis not present

## 2023-07-15 DIAGNOSIS — T25222A Burn of second degree of left foot, initial encounter: Secondary | ICD-10-CM | POA: Diagnosis not present

## 2023-07-15 DIAGNOSIS — L03116 Cellulitis of left lower limb: Secondary | ICD-10-CM | POA: Diagnosis not present

## 2023-07-15 DIAGNOSIS — T23232D Burn of second degree of multiple left fingers (nail), not including thumb, subsequent encounter: Secondary | ICD-10-CM | POA: Diagnosis not present

## 2023-07-29 DIAGNOSIS — T25322D Burn of third degree of left foot, subsequent encounter: Secondary | ICD-10-CM | POA: Diagnosis not present

## 2023-07-29 DIAGNOSIS — T310 Burns involving less than 10% of body surface: Secondary | ICD-10-CM | POA: Diagnosis not present

## 2023-07-29 DIAGNOSIS — T23232D Burn of second degree of multiple left fingers (nail), not including thumb, subsequent encounter: Secondary | ICD-10-CM | POA: Diagnosis not present

## 2023-07-29 DIAGNOSIS — X118XXD Contact with other hot tap-water, subsequent encounter: Secondary | ICD-10-CM | POA: Diagnosis not present

## 2023-07-29 DIAGNOSIS — T25322A Burn of third degree of left foot, initial encounter: Secondary | ICD-10-CM | POA: Diagnosis not present

## 2023-07-29 DIAGNOSIS — T25222D Burn of second degree of left foot, subsequent encounter: Secondary | ICD-10-CM | POA: Diagnosis not present

## 2023-07-29 DIAGNOSIS — L03116 Cellulitis of left lower limb: Secondary | ICD-10-CM | POA: Diagnosis not present

## 2023-08-12 DIAGNOSIS — T25222D Burn of second degree of left foot, subsequent encounter: Secondary | ICD-10-CM | POA: Diagnosis not present

## 2023-08-12 DIAGNOSIS — L905 Scar conditions and fibrosis of skin: Secondary | ICD-10-CM | POA: Diagnosis not present

## 2023-08-12 DIAGNOSIS — X12XXXD Contact with other hot fluids, subsequent encounter: Secondary | ICD-10-CM | POA: Diagnosis not present

## 2023-08-12 DIAGNOSIS — T25322A Burn of third degree of left foot, initial encounter: Secondary | ICD-10-CM | POA: Diagnosis not present

## 2023-08-12 DIAGNOSIS — T23232D Burn of second degree of multiple left fingers (nail), not including thumb, subsequent encounter: Secondary | ICD-10-CM | POA: Diagnosis not present

## 2023-08-12 DIAGNOSIS — T25322D Burn of third degree of left foot, subsequent encounter: Secondary | ICD-10-CM | POA: Diagnosis not present

## 2023-09-23 DIAGNOSIS — T310 Burns involving less than 10% of body surface: Secondary | ICD-10-CM | POA: Diagnosis not present

## 2023-09-23 DIAGNOSIS — T23232D Burn of second degree of multiple left fingers (nail), not including thumb, subsequent encounter: Secondary | ICD-10-CM | POA: Diagnosis not present

## 2023-09-23 DIAGNOSIS — L905 Scar conditions and fibrosis of skin: Secondary | ICD-10-CM | POA: Diagnosis not present

## 2023-09-23 DIAGNOSIS — T25322D Burn of third degree of left foot, subsequent encounter: Secondary | ICD-10-CM | POA: Diagnosis not present

## 2023-09-23 DIAGNOSIS — X101XXD Contact with hot food, subsequent encounter: Secondary | ICD-10-CM | POA: Diagnosis not present

## 2023-09-23 DIAGNOSIS — T25222D Burn of second degree of left foot, subsequent encounter: Secondary | ICD-10-CM | POA: Diagnosis not present

## 2024-02-26 HISTORY — PX: BREAST ENHANCEMENT SURGERY: SHX7

## 2024-04-09 ENCOUNTER — Other Ambulatory Visit: Payer: Self-pay

## 2024-04-09 DIAGNOSIS — Z3041 Encounter for surveillance of contraceptive pills: Secondary | ICD-10-CM

## 2024-04-09 MED ORDER — LEVONORGEST-ETH ESTRAD 91-DAY 0.15-0.03 MG PO TABS: 1.0000 | ORAL_TABLET | Freq: Every day | ORAL | 0 refills | Status: DC

## 2024-04-21 NOTE — Progress Notes (Unsigned)
 PCP:  Patient, No Pcp Per   No chief complaint on file.    HPI:      Jessica Cohen is a 38 y.o. [id number] who LMP was No LMP recorded. (Menstrual status: Oral contraceptives)., presents today for her annual examination.  Her menses are regular every 3 months with OCPs, lasting 3-4 days.  Dysmenorrhea none. She does not have intermenstrual bleeding.  Sex activity: single partner, contraception - OCP (estrogen/progesterone).  Last Pap: 12/27/21  Results were: no abnormalities /neg HPV DNA Hx of STDs: ext HPV, exposure from husband. Treated with TCA in past.   Mammogram: 09/05/21  Results were normal with benign RT breast cysts after addl imaging, repeat in 12 months.  There is a FH of breast cancer in her MGM 3 times now. Pt is MyRisk testing neg 12/21; IBIS=20.3%/riskscore=20.1%. There is no FH of ovarian cancer. The patient does do self-breast exams. Hasn't had screening breast MRI. Not taking Vit D supp.   Tobacco use: The patient denies current or previous tobacco use. Alcohol use: none No drug use.  Exercise: min active  She does get adequate calcium and Vitamin D in her diet.   Past Medical History:  Diagnosis Date   BRCA negative 09/2020   MyRisk neg   Family history of breast cancer    Genital warts    History of Papanicolaou smear of cervix 08/01/2014   NIL   Increased risk of breast cancer 09/2020   IBIS=20.3%/riskscore=20.1%   Urinary tract infection     Past Surgical History:  Procedure Laterality Date   WISDOM TOOTH EXTRACTION      Family History  Problem Relation Age of Onset   Hypertension Mother    Hypertension Father    Breast cancer Maternal Grandmother 90       reoccured 3 xs   Diabetes Mellitus II Maternal Grandfather    Heart disease Maternal Grandfather     Social History   Socioeconomic History   Marital status: Married    Spouse name: Not on file   Number of children: Not on file   Years of education: Not on file   Highest  education level: Not on file  Occupational History   Not on file  Tobacco Use   Smoking status: Former    Current packs/day: 0.00    Average packs/day: 1 pack/day for 10.0 years (10.0 ttl pk-yrs)    Types: Cigarettes    Start date: 05/13/2007    Quit date: 05/12/2017    Years since quitting: 6.9   Smokeless tobacco: Never  Vaping Use   Vaping status: Never Used  Substance and Sexual Activity   Alcohol use: Not Currently   Drug use: No   Sexual activity: Yes    Birth control/protection: Pill  Other Topics Concern   Not on file  Social History Narrative   Not on file   Social Drivers of Health   Financial Resource Strain: Not on file  Food Insecurity: Not on file  Transportation Needs: Not on file  Physical Activity: Not on file  Stress: Not on file  Social Connections: Not on file  Intimate Partner Violence: Not on file    No outpatient medications have been marked as taking for the 04/22/24 encounter (Appointment) with Heddy Vidana B, PA-C.     ROS:  Review of Systems  Constitutional:  Negative for fatigue, fever and unexpected weight change.  Respiratory:  Negative for cough, shortness of breath and wheezing.   Cardiovascular:  Negative for chest pain, palpitations and leg swelling.  Gastrointestinal:  Negative for blood in stool, constipation, diarrhea, nausea and vomiting.  Endocrine: Negative for cold intolerance, heat intolerance and polyuria.  Genitourinary:  Negative for dyspareunia, dysuria, flank pain, frequency, genital sores, hematuria, menstrual problem, pelvic pain, urgency, vaginal bleeding, vaginal discharge and vaginal pain.  Musculoskeletal:  Negative for back pain, joint swelling and myalgias.  Skin:  Negative for rash.  Neurological:  Negative for dizziness, syncope, light-headedness, numbness and headaches.  Hematological:  Negative for adenopathy.  Psychiatric/Behavioral:  Negative for agitation, confusion, sleep disturbance and suicidal ideas.  The patient is not nervous/anxious.      Objective: There were no vitals taken for this visit.   Physical Exam Constitutional:      Appearance: She is well-developed.  Genitourinary:     Vulva normal.     Right Labia: No rash, tenderness or lesions.    Left Labia: No tenderness, lesions or rash.    No vaginal discharge, erythema or tenderness.      Right Adnexa: not tender and no mass present.    Left Adnexa: not tender and no mass present.    No cervical friability or polyp.     Uterus is not enlarged or tender.  Breasts:    Right: No mass, nipple discharge, skin change or tenderness.     Left: No mass, nipple discharge, skin change or tenderness.  Neck:     Thyroid: No thyromegaly.   Cardiovascular:     Rate and Rhythm: Normal rate and regular rhythm.     Heart sounds: Normal heart sounds. No murmur heard. Pulmonary:     Effort: Pulmonary effort is normal.     Breath sounds: Normal breath sounds.  Abdominal:     Palpations: Abdomen is soft.     Tenderness: There is no abdominal tenderness. There is no guarding or rebound.   Musculoskeletal:        General: Normal range of motion.     Cervical back: Normal range of motion.  Lymphadenopathy:     Cervical: No cervical adenopathy.   Neurological:     General: No focal deficit present.     Mental Status: She is alert and oriented to person, place, and time.     Cranial Nerves: No cranial nerve deficit.   Skin:    General: Skin is warm and dry.   Psychiatric:        Mood and Affect: Mood normal.        Behavior: Behavior normal.        Thought Content: Thought content normal.        Judgment: Judgment normal.  Vitals reviewed.     Assessment/Plan: Encounter for annual routine gynecological examination  Encounter for surveillance of contraceptive pills - Plan: levonorgestrel-ethinyl estradiol (SEASONALE) 0.15-0.03 MG tablet  Encounter for screening mammogram for malignant neoplasm of breast - Plan: MM 3D  SCREEN BREAST BILATERAL, pt to schedule mammo  Family history of breast cancer - Plan: MM 3D SCREEN BREAST BILATERAL, Pt is MyRisk neg.   Increased risk of breast cancer - Plan: MM 3D SCREEN BREAST BILATERAL, pt aware of recommendations of monthly SBE, yearly CBE and mammos as well as scr breast MRI. Cont Vit D supp. F/u prn.    No orders of the defined types were placed in this encounter.            GYN counsel adequate intake of calcium and vitamin D, diet and exercise  F/U  No follow-ups on file.  Liannah Yarbough B. Sharelle Burditt, PA-C 04/21/2024 4:06 PM

## 2024-04-22 ENCOUNTER — Ambulatory Visit (INDEPENDENT_AMBULATORY_CARE_PROVIDER_SITE_OTHER): Admitting: Obstetrics and Gynecology

## 2024-04-22 ENCOUNTER — Encounter: Payer: Self-pay | Admitting: Obstetrics and Gynecology

## 2024-04-22 VITALS — BP 137/81 | HR 96 | Ht 67.0 in | Wt 195.0 lb

## 2024-04-22 DIAGNOSIS — Z9189 Other specified personal risk factors, not elsewhere classified: Secondary | ICD-10-CM | POA: Diagnosis not present

## 2024-04-22 DIAGNOSIS — Z3041 Encounter for surveillance of contraceptive pills: Secondary | ICD-10-CM | POA: Diagnosis not present

## 2024-04-22 DIAGNOSIS — Z803 Family history of malignant neoplasm of breast: Secondary | ICD-10-CM | POA: Diagnosis not present

## 2024-04-22 DIAGNOSIS — Z1231 Encounter for screening mammogram for malignant neoplasm of breast: Secondary | ICD-10-CM

## 2024-04-22 DIAGNOSIS — Z01419 Encounter for gynecological examination (general) (routine) without abnormal findings: Secondary | ICD-10-CM

## 2024-04-22 MED ORDER — LEVONORGEST-ETH ESTRAD 91-DAY 0.15-0.03 MG PO TABS: 1.0000 | ORAL_TABLET | Freq: Every day | ORAL | 3 refills | Status: AC

## 2024-04-22 NOTE — Patient Instructions (Signed)
 I value your feedback and you entrusting [place] with your care. If you get a [place] patient survey, I would appreciate you taking the time to let us know about your experience today. Thank you! ? ? ?

## 2024-09-29 DIAGNOSIS — M9901 Segmental and somatic dysfunction of cervical region: Secondary | ICD-10-CM | POA: Diagnosis not present

## 2024-09-29 DIAGNOSIS — M9904 Segmental and somatic dysfunction of sacral region: Secondary | ICD-10-CM | POA: Diagnosis not present

## 2024-09-29 DIAGNOSIS — M9902 Segmental and somatic dysfunction of thoracic region: Secondary | ICD-10-CM | POA: Diagnosis not present

## 2024-09-29 DIAGNOSIS — M9903 Segmental and somatic dysfunction of lumbar region: Secondary | ICD-10-CM | POA: Diagnosis not present

## 2024-10-05 DIAGNOSIS — M9901 Segmental and somatic dysfunction of cervical region: Secondary | ICD-10-CM | POA: Diagnosis not present

## 2024-10-05 DIAGNOSIS — M9902 Segmental and somatic dysfunction of thoracic region: Secondary | ICD-10-CM | POA: Diagnosis not present

## 2024-10-05 DIAGNOSIS — M9903 Segmental and somatic dysfunction of lumbar region: Secondary | ICD-10-CM | POA: Diagnosis not present

## 2024-10-05 DIAGNOSIS — M9904 Segmental and somatic dysfunction of sacral region: Secondary | ICD-10-CM | POA: Diagnosis not present

## 2024-10-07 DIAGNOSIS — M9901 Segmental and somatic dysfunction of cervical region: Secondary | ICD-10-CM | POA: Diagnosis not present

## 2024-10-07 DIAGNOSIS — M9902 Segmental and somatic dysfunction of thoracic region: Secondary | ICD-10-CM | POA: Diagnosis not present

## 2024-10-07 DIAGNOSIS — M9903 Segmental and somatic dysfunction of lumbar region: Secondary | ICD-10-CM | POA: Diagnosis not present

## 2024-10-07 DIAGNOSIS — M9904 Segmental and somatic dysfunction of sacral region: Secondary | ICD-10-CM | POA: Diagnosis not present

## 2024-10-12 DIAGNOSIS — M9904 Segmental and somatic dysfunction of sacral region: Secondary | ICD-10-CM | POA: Diagnosis not present

## 2024-10-12 DIAGNOSIS — M9901 Segmental and somatic dysfunction of cervical region: Secondary | ICD-10-CM | POA: Diagnosis not present

## 2024-10-12 DIAGNOSIS — M9902 Segmental and somatic dysfunction of thoracic region: Secondary | ICD-10-CM | POA: Diagnosis not present

## 2024-10-12 DIAGNOSIS — M9903 Segmental and somatic dysfunction of lumbar region: Secondary | ICD-10-CM | POA: Diagnosis not present

## 2024-10-19 DIAGNOSIS — M9904 Segmental and somatic dysfunction of sacral region: Secondary | ICD-10-CM | POA: Diagnosis not present

## 2024-10-19 DIAGNOSIS — M9901 Segmental and somatic dysfunction of cervical region: Secondary | ICD-10-CM | POA: Diagnosis not present

## 2024-10-19 DIAGNOSIS — M9902 Segmental and somatic dysfunction of thoracic region: Secondary | ICD-10-CM | POA: Diagnosis not present

## 2024-10-19 DIAGNOSIS — M9903 Segmental and somatic dysfunction of lumbar region: Secondary | ICD-10-CM | POA: Diagnosis not present

## 2024-10-26 DIAGNOSIS — M9903 Segmental and somatic dysfunction of lumbar region: Secondary | ICD-10-CM | POA: Diagnosis not present

## 2024-10-26 DIAGNOSIS — M9901 Segmental and somatic dysfunction of cervical region: Secondary | ICD-10-CM | POA: Diagnosis not present

## 2024-10-26 DIAGNOSIS — M9904 Segmental and somatic dysfunction of sacral region: Secondary | ICD-10-CM | POA: Diagnosis not present

## 2024-10-26 DIAGNOSIS — M9902 Segmental and somatic dysfunction of thoracic region: Secondary | ICD-10-CM | POA: Diagnosis not present
